# Patient Record
Sex: Female | Born: 1935 | Race: Black or African American | Hispanic: No | Smoking: Never smoker
Health system: Southern US, Community
[De-identification: ages and names within clinical notes are randomized; demographics above are authoritative.]

## PROBLEM LIST (undated history)

## (undated) DIAGNOSIS — I255 Ischemic cardiomyopathy: Secondary | ICD-10-CM

## (undated) DIAGNOSIS — I639 Cerebral infarction, unspecified: Secondary | ICD-10-CM

## (undated) DIAGNOSIS — E119 Type 2 diabetes mellitus without complications: Secondary | ICD-10-CM

## (undated) DIAGNOSIS — I480 Paroxysmal atrial fibrillation: Secondary | ICD-10-CM

## (undated) DIAGNOSIS — I10 Essential (primary) hypertension: Secondary | ICD-10-CM

## (undated) DIAGNOSIS — R413 Other amnesia: Secondary | ICD-10-CM

## (undated) DIAGNOSIS — I509 Heart failure, unspecified: Secondary | ICD-10-CM

## (undated) DIAGNOSIS — I2510 Atherosclerotic heart disease of native coronary artery without angina pectoris: Secondary | ICD-10-CM

---

## 2015-08-23 ENCOUNTER — Inpatient Hospital Stay (HOSPITAL_COMMUNITY)
Admit: 2015-08-23 | Discharge: 2015-08-23 | Disposition: A | Discharge status: Home / Self Care | Payer: Medicare HMO | Attending: Internal Medicine | Admitting: Internal Medicine

## 2015-08-23 ENCOUNTER — Inpatient Hospital Stay
Admission: EM | Admit: 2015-08-23 | Discharge: 2015-08-25 | DRG: 292 | Disposition: A | Discharge status: Home / Self Care | Payer: Medicare HMO | Attending: Internal Medicine | Admitting: Internal Medicine

## 2015-08-23 ENCOUNTER — Emergency Department: Payer: Medicare HMO

## 2015-08-23 ENCOUNTER — Encounter: Payer: Self-pay | Admitting: Emergency Medicine

## 2015-08-23 DIAGNOSIS — R7989 Other specified abnormal findings of blood chemistry: Secondary | ICD-10-CM

## 2015-08-23 DIAGNOSIS — Z806 Family history of leukemia: Secondary | ICD-10-CM

## 2015-08-23 DIAGNOSIS — R0902 Hypoxemia: Secondary | ICD-10-CM | POA: Diagnosis present

## 2015-08-23 DIAGNOSIS — J810 Acute pulmonary edema: Secondary | ICD-10-CM

## 2015-08-23 DIAGNOSIS — R778 Other specified abnormalities of plasma proteins: Secondary | ICD-10-CM

## 2015-08-23 DIAGNOSIS — E119 Type 2 diabetes mellitus without complications: Secondary | ICD-10-CM | POA: Diagnosis present

## 2015-08-23 DIAGNOSIS — Z6841 Body Mass Index (BMI) 40.0 and over, adult: Secondary | ICD-10-CM | POA: Diagnosis not present

## 2015-08-23 DIAGNOSIS — I69398 Other sequelae of cerebral infarction: Secondary | ICD-10-CM

## 2015-08-23 DIAGNOSIS — R0602 Shortness of breath: Secondary | ICD-10-CM | POA: Diagnosis present

## 2015-08-23 DIAGNOSIS — I10 Essential (primary) hypertension: Secondary | ICD-10-CM

## 2015-08-23 DIAGNOSIS — I5043 Acute on chronic combined systolic (congestive) and diastolic (congestive) heart failure: Secondary | ICD-10-CM | POA: Diagnosis present

## 2015-08-23 DIAGNOSIS — I5023 Acute on chronic systolic (congestive) heart failure: Secondary | ICD-10-CM | POA: Diagnosis not present

## 2015-08-23 DIAGNOSIS — I69311 Memory deficit following cerebral infarction: Secondary | ICD-10-CM

## 2015-08-23 DIAGNOSIS — I2510 Atherosclerotic heart disease of native coronary artery without angina pectoris: Secondary | ICD-10-CM | POA: Diagnosis present

## 2015-08-23 DIAGNOSIS — I480 Paroxysmal atrial fibrillation: Secondary | ICD-10-CM | POA: Diagnosis present

## 2015-08-23 DIAGNOSIS — I25111 Atherosclerotic heart disease of native coronary artery with angina pectoris with documented spasm: Secondary | ICD-10-CM | POA: Diagnosis not present

## 2015-08-23 DIAGNOSIS — I255 Ischemic cardiomyopathy: Secondary | ICD-10-CM | POA: Diagnosis present

## 2015-08-23 DIAGNOSIS — I110 Hypertensive heart disease with heart failure: Secondary | ICD-10-CM | POA: Diagnosis present

## 2015-08-23 DIAGNOSIS — Z79899 Other long term (current) drug therapy: Secondary | ICD-10-CM

## 2015-08-23 DIAGNOSIS — H539 Unspecified visual disturbance: Secondary | ICD-10-CM | POA: Diagnosis present

## 2015-08-23 DIAGNOSIS — I639 Cerebral infarction, unspecified: Secondary | ICD-10-CM

## 2015-08-23 DIAGNOSIS — R0682 Tachypnea, not elsewhere classified: Secondary | ICD-10-CM | POA: Diagnosis present

## 2015-08-23 DIAGNOSIS — I248 Other forms of acute ischemic heart disease: Secondary | ICD-10-CM | POA: Diagnosis present

## 2015-08-23 DIAGNOSIS — I509 Heart failure, unspecified: Secondary | ICD-10-CM

## 2015-08-23 DIAGNOSIS — Z803 Family history of malignant neoplasm of breast: Secondary | ICD-10-CM | POA: Diagnosis not present

## 2015-08-23 DIAGNOSIS — Z7902 Long term (current) use of antithrombotics/antiplatelets: Secondary | ICD-10-CM

## 2015-08-23 DIAGNOSIS — I340 Nonrheumatic mitral (valve) insufficiency: Secondary | ICD-10-CM | POA: Diagnosis not present

## 2015-08-23 DIAGNOSIS — I4891 Unspecified atrial fibrillation: Secondary | ICD-10-CM

## 2015-08-23 HISTORY — DX: Cerebral infarction, unspecified: I63.9

## 2015-08-23 HISTORY — DX: Other amnesia: R41.3

## 2015-08-23 HISTORY — DX: Essential (primary) hypertension: I10

## 2015-08-23 HISTORY — DX: Atherosclerotic heart disease of native coronary artery without angina pectoris: I25.10

## 2015-08-23 HISTORY — DX: Ischemic cardiomyopathy: I25.5

## 2015-08-23 HISTORY — DX: Type 2 diabetes mellitus without complications: E11.9

## 2015-08-23 HISTORY — DX: Morbid (severe) obesity due to excess calories: E66.01

## 2015-08-23 HISTORY — DX: Paroxysmal atrial fibrillation: I48.0

## 2015-08-23 LAB — CBC WITH DIFFERENTIAL/PLATELET
BASOS ABS: 0 10*3/uL (ref 0–0.1)
Basophils Relative: 0 %
EOS ABS: 0.1 10*3/uL (ref 0–0.7)
Eosinophils Relative: 1 %
HCT: 29.3 % — ABNORMAL LOW (ref 35.0–47.0)
Hemoglobin: 11.6 g/dL — ABNORMAL LOW (ref 12.0–16.0)
LYMPHS ABS: 2.3 10*3/uL (ref 1.0–3.6)
Lymphocytes Relative: 30 %
MCH: 39.7 pg — ABNORMAL HIGH (ref 26.0–34.0)
MCHC: 39.7 g/dL — AB (ref 32.0–36.0)
MCV: 99.8 fL (ref 80.0–100.0)
Monocytes Absolute: 0.8 10*3/uL (ref 0.2–0.9)
Monocytes Relative: 10 %
NEUTROS ABS: 4.5 10*3/uL (ref 1.4–6.5)
Neutrophils Relative %: 59 %
Platelets: 200 10*3/uL (ref 150–440)
RBC: 2.93 MIL/uL — ABNORMAL LOW (ref 3.80–5.20)
RDW: 24.8 % — AB (ref 11.5–14.5)
WBC: 7.7 10*3/uL (ref 3.6–11.0)

## 2015-08-23 LAB — COMPREHENSIVE METABOLIC PANEL
ALT: 16 U/L (ref 14–54)
AST: 27 U/L (ref 15–41)
Albumin: 3.9 g/dL (ref 3.5–5.0)
Alkaline Phosphatase: 117 U/L (ref 38–126)
Anion gap: 7 (ref 5–15)
BILIRUBIN TOTAL: 1.4 mg/dL — AB (ref 0.3–1.2)
BUN: 12 mg/dL (ref 6–20)
CALCIUM: 8.7 mg/dL — AB (ref 8.9–10.3)
CO2: 22 mmol/L (ref 22–32)
CREATININE: 0.87 mg/dL (ref 0.44–1.00)
Chloride: 112 mmol/L — ABNORMAL HIGH (ref 101–111)
GFR calc Af Amer: >60 mL/min (ref >60)
Glucose, Bld: 140 mg/dL — ABNORMAL HIGH (ref 65–99)
Potassium: 4.7 mmol/L (ref 3.5–5.1)
Sodium: 141 mmol/L (ref 135–145)
TOTAL PROTEIN: 7.5 g/dL (ref 6.5–8.1)

## 2015-08-23 LAB — GLUCOSE, CAPILLARY
GLUCOSE-CAPILLARY: 128 mg/dL — AB (ref 65–99)
Glucose-Capillary: 210 mg/dL — ABNORMAL HIGH (ref 65–99)

## 2015-08-23 LAB — BRAIN NATRIURETIC PEPTIDE: B Natriuretic Peptide: 651 pg/mL — ABNORMAL HIGH (ref 0.0–100.0)

## 2015-08-23 LAB — TROPONIN I: TROPONIN I: 0.05 ng/mL — AB (ref <0.031)

## 2015-08-23 MED ORDER — INSULIN ASPART 100 UNIT/ML ~~LOC~~ SOLN
0.0000 [IU] | Freq: Three times a day (TID) | SUBCUTANEOUS | Status: DC
  Administered 2015-08-23: 2 [IU] via SUBCUTANEOUS
  Administered 2015-08-23: 3 [IU] via SUBCUTANEOUS
  Administered 2015-08-24: 2 [IU] via SUBCUTANEOUS
  Administered 2015-08-24 – 2015-08-25 (×4): 1 [IU] via SUBCUTANEOUS
  Administered 2015-08-25: 2 [IU] via SUBCUTANEOUS
  Filled 2015-08-23: qty 3
  Filled 2015-08-23: qty 1
  Filled 2015-08-23: qty 2
  Filled 2015-08-23 (×2): qty 1
  Filled 2015-08-23 (×2): qty 2
  Filled 2015-08-23: qty 1

## 2015-08-23 MED ORDER — METOPROLOL SUCCINATE ER 100 MG PO TB24
100.0000 mg | ORAL_TABLET | Freq: Every day | ORAL | Status: DC
  Administered 2015-08-23 – 2015-08-25 (×3): 100 mg via ORAL
  Filled 2015-08-23 (×3): qty 1

## 2015-08-23 MED ORDER — APIXABAN 5 MG PO TABS
5.0000 mg | ORAL_TABLET | Freq: Two times a day (BID) | ORAL | Status: DC
  Administered 2015-08-23 – 2015-08-25 (×5): 5 mg via ORAL
  Filled 2015-08-23 (×5): qty 1

## 2015-08-23 MED ORDER — CLOPIDOGREL BISULFATE 75 MG PO TABS
75.0000 mg | ORAL_TABLET | Freq: Every day | ORAL | Status: DC
  Administered 2015-08-23 – 2015-08-25 (×3): 75 mg via ORAL
  Filled 2015-08-23 (×3): qty 1

## 2015-08-23 MED ORDER — ACETAMINOPHEN 325 MG PO TABS: 650.0000 mg | ORAL_TABLET | Freq: Four times a day (QID) | ORAL | Status: DC | PRN

## 2015-08-23 MED ORDER — NITROGLYCERIN 2 % TD OINT
0.5000 [in_us] | TOPICAL_OINTMENT | Freq: Four times a day (QID) | TRANSDERMAL | Status: DC
  Administered 2015-08-23 – 2015-08-24 (×4): 0.5 [in_us] via TOPICAL
  Filled 2015-08-23 (×3): qty 1

## 2015-08-23 MED ORDER — ASPIRIN 81 MG PO CHEW
324.0000 mg | CHEWABLE_TABLET | Freq: Once | ORAL | Status: AC
  Administered 2015-08-23: 324 mg via ORAL
  Filled 2015-08-23: qty 4

## 2015-08-23 MED ORDER — LINAGLIPTIN 5 MG PO TABS
5.0000 mg | ORAL_TABLET | Freq: Every day | ORAL | Status: DC
  Administered 2015-08-23 – 2015-08-25 (×3): 5 mg via ORAL
  Filled 2015-08-23 (×3): qty 1

## 2015-08-23 MED ORDER — NITROGLYCERIN 2 % TD OINT
1.0000 [in_us] | TOPICAL_OINTMENT | Freq: Once | TRANSDERMAL | Status: DC
  Filled 2015-08-23: qty 1

## 2015-08-23 MED ORDER — HYDRALAZINE HCL 50 MG PO TABS
50.0000 mg | ORAL_TABLET | Freq: Three times a day (TID) | ORAL | Status: DC
  Administered 2015-08-23 – 2015-08-24 (×3): 50 mg via ORAL
  Filled 2015-08-23 (×3): qty 1

## 2015-08-23 MED ORDER — SODIUM CHLORIDE 0.9 % IJ SOLN
3.0000 mL | Freq: Two times a day (BID) | INTRAMUSCULAR | Status: DC
  Administered 2015-08-23 – 2015-08-25 (×4): 3 mL via INTRAVENOUS

## 2015-08-23 MED ORDER — INSULIN ASPART 100 UNIT/ML ~~LOC~~ SOLN
4.0000 [IU] | Freq: Three times a day (TID) | SUBCUTANEOUS | Status: DC
  Administered 2015-08-23 – 2015-08-25 (×8): 4 [IU] via SUBCUTANEOUS
  Filled 2015-08-23 (×8): qty 4

## 2015-08-23 MED ORDER — FUROSEMIDE 10 MG/ML IJ SOLN
20.0000 mg | Freq: Two times a day (BID) | INTRAMUSCULAR | Status: DC
  Administered 2015-08-23 – 2015-08-24 (×3): 20 mg via INTRAVENOUS
  Filled 2015-08-23: qty 2
  Filled 2015-08-23: qty 4
  Filled 2015-08-23: qty 2

## 2015-08-23 MED ORDER — ATORVASTATIN CALCIUM 20 MG PO TABS
40.0000 mg | ORAL_TABLET | Freq: Every day | ORAL | Status: DC
  Administered 2015-08-24 – 2015-08-25 (×2): 40 mg via ORAL
  Filled 2015-08-23 (×3): qty 2

## 2015-08-23 MED ORDER — SODIUM CHLORIDE 0.9 % IV SOLN: 250.0000 mL | INTRAVENOUS | Status: DC | PRN

## 2015-08-23 MED ORDER — ACETAMINOPHEN 650 MG RE SUPP: 650.0000 mg | Freq: Four times a day (QID) | RECTAL | Status: DC | PRN

## 2015-08-23 MED ORDER — IRBESARTAN 75 MG PO TABS
75.0000 mg | ORAL_TABLET | Freq: Every day | ORAL | Status: DC
  Administered 2015-08-23 – 2015-08-25 (×3): 75 mg via ORAL
  Filled 2015-08-23 (×3): qty 1

## 2015-08-23 MED ORDER — SODIUM CHLORIDE 0.9 % IJ SOLN
3.0000 mL | Freq: Two times a day (BID) | INTRAMUSCULAR | Status: DC
  Administered 2015-08-23: 3 mL via INTRAVENOUS

## 2015-08-23 MED ORDER — VITAMIN B-12 1000 MCG PO TABS
1000.0000 ug | ORAL_TABLET | Freq: Every day | ORAL | Status: DC
  Administered 2015-08-23 – 2015-08-25 (×3): 1000 ug via ORAL
  Filled 2015-08-23 (×3): qty 1

## 2015-08-23 MED ORDER — SODIUM CHLORIDE 0.9 % IJ SOLN: 3.0000 mL | INTRAMUSCULAR | Status: DC | PRN

## 2015-08-23 MED ORDER — FUROSEMIDE 10 MG/ML IJ SOLN
20.0000 mg | Freq: Once | INTRAMUSCULAR | Status: AC
  Administered 2015-08-23: 20 mg via INTRAVENOUS
  Filled 2015-08-23: qty 4

## 2015-08-23 MED ORDER — INSULIN ASPART 100 UNIT/ML ~~LOC~~ SOLN: 0.0000 [IU] | Freq: Every day | SUBCUTANEOUS | Status: DC

## 2015-08-23 MED ORDER — DILTIAZEM HCL ER COATED BEADS 120 MG PO CP24
120.0000 mg | ORAL_CAPSULE | Freq: Every day | ORAL | Status: DC
  Administered 2015-08-23 – 2015-08-25 (×3): 120 mg via ORAL
  Filled 2015-08-23 (×3): qty 1

## 2015-08-23 NOTE — Consult Note (Signed)
Cardiology Consultation Note  Patient ID: Elizabeth RampGlenda Bailly, MRN: [id number], DOB/AGE: 78/01/1935 78 y.o. Admit date: 08/23/2015   Date of Consult: 08/23/2015 Primary Physician: No PCP Per Patient Primary Cardiologist: Dr. Redmond Pullingroy, Leo, MD [facility]  Chief Complaint: SOB Reason for Consult: Acute on chronic systolic CHF  HPI: 80 y.o. female with h/o CAD medically managed by cardiac cath 11/2014, PAF on Eliquis, recent right sided stroke, with residual eye vision and memory loss, HTN, DM2, and morbid obesity who [facility] with increasing SOB over the past couple of days. Cardiology was consulted for evaluation of possible acute on chronic ischemic cardiomyopathy.   She is followed for her cardiology diagnoses by [facility]. She underwent cardiac catheterization in the setting of worsening SOB and acute respiratory failure in 11/2014 that showed patient daughter reported three vessel disease, and was medically managed at that time. Full cath report and any possible echo has been [facility]. Since then she had done well per her daughter up until September 2016 when she suffered an acute stroke. Her Eliquis had not been held for any procedure leading up to this, nor had she missed any doses of the medication per the daughter. She is recovering well from the stroke.   Per the daughter, the patient began to develop increasing SOB over the past couple of days with associated early satiety. She also notes some lower extremity edema. Weight has been "excellent." Last weight at her nursing home 4 days ago was 242, and upon her admission to [facility] her weight was 242. She has been sleeping with 1-2 pillows. She does not use extra salt, nor does she eat out at restaurants.   Upon her arrival to [facility] she was noted to be mildly hypoxic with O2 sats mildly low at 93%, requiring supplemental oxygen via [place]. CXR showed vascular congestion and mild cardiomegaly with increased interstitial markings,  concerning for pulmonary edema. BNP 651, troponin 0.05. She was started on IV Lasix 20 mg bid. Echo is pending. She is now off O2 and resting comfortably. We have contacted [facility] and faxed records release to gather her cardiology records.      Past Medical History  Diagnosis Date  . Hypertension   . PAF (paroxysmal atrial fibrillation) (HCC)     a. on Eliquis  . Diabetes mellitus without complication (HCC)   . CAD (coronary artery disease)     a. medically managed; b. last cardiac cath 11/2014  . Morbid obesity (HCC)   . Stroke (HCC)   . Diabetes mellitus (HCC)   . Memory loss   . Ischemic cardiomyopathy       Most Recent Cardiac Studies: [facility] - release has been faxed as of 2:30 PM, 08/23/2015.    Surgical History: History reviewed. No pertinent past surgical history.   Home Meds: Prior to Admission medications   Medication Sig Start Date End Date Taking? Authorizing Provider  apixaban (ELIQUIS) 5 MG TABS tablet Take 5 mg by mouth 2 (two) times daily.   Yes Historical Provider, MD  clopidogrel (PLAVIX) 75 MG tablet Take 75 mg by mouth daily.   Yes Historical Provider, MD  diltiazem (CARDIZEM CD) 120 MG 24 hr capsule Take 120 mg by mouth daily.   Yes Historical Provider, MD  hydrALAZINE (APRESOLINE) 50 MG tablet Take 50 mg by mouth every 8 (eight) hours.    Yes Historical Provider, MD  metoprolol succinate (TOPROL-XL) 100 MG 24 hr tablet Take 100 mg  by mouth daily.    Yes Historical Provider, MD  sitaGLIPtin (JANUVIA) 100 MG tablet Take 100 mg by mouth daily.   Yes Historical Provider, MD  valsartan (DIOVAN) 80 MG tablet Take 80 mg by mouth daily.   Yes Historical Provider, MD  vitamin B-12 (CYANOCOBALAMIN) 1000 MCG tablet Take 1,000 mcg by mouth daily.   Yes Historical Provider, MD    Inpatient Medications:  . apixaban  5 mg Oral BID  . atorvastatin  40 mg Oral q1800  . clopidogrel  75 mg Oral Daily  . diltiazem  120 mg Oral Daily  . furosemide  20 mg  Intravenous BID  . hydrALAZINE  50 mg Oral 3 times per day  . insulin aspart  0-5 Units Subcutaneous QHS  . insulin aspart  0-9 Units Subcutaneous TID WC  . insulin aspart  4 Units Subcutaneous TID WC  . irbesartan  75 mg Oral Daily  . linagliptin  5 mg Oral Daily  . metoprolol succinate  100 mg Oral Daily  . nitroGLYCERIN  0.5 inch Topical 4 times per day  . nitroGLYCERIN  1 inch Topical Once  . sodium chloride  3 mL Intravenous Q12H  . sodium chloride  3 mL Intravenous Q12H  . vitamin B-12  1,000 mcg Oral Daily      Allergies: No Known Allergies  Social History   Social History  . Marital Status: Widowed    Spouse Name: N/A  . Number of Children: N/A  . Years of Education: N/A   Occupational History  . Not on file.   Social History Main Topics  . Smoking status: Never Smoker   . Smokeless tobacco: Not on file  . Alcohol Use: No  . Drug Use: Not on file  . Sexual Activity: Not on file   Other Topics Concern  . Not on file   Social History Narrative  . No narrative on file     History reviewed. No pertinent family history.   Review of Systems: Review of Systems  Constitutional: Positive for weight loss and malaise/fatigue. Negative for fever, chills and diaphoresis.  HENT: Negative for congestion.   Eyes: Positive for blurred vision. Negative for pain, discharge and redness.  Respiratory: Positive for shortness of breath. Negative for cough, hemoptysis, sputum production and wheezing.   Cardiovascular: Positive for palpitations and leg swelling. Negative for chest pain, orthopnea, claudication and PND.  Gastrointestinal: Negative for heartburn, nausea, vomiting, abdominal pain, blood in stool and melena.  Genitourinary: Negative for hematuria.  Musculoskeletal: Negative for myalgias and falls.  Skin: Negative for rash.  Neurological: Positive for weakness. Negative for dizziness, tingling, tremors, sensory change, speech change and focal weakness.    Endo/Heme/Allergies: Does not bruise/bleed easily.  Psychiatric/Behavioral: Negative for substance abuse. The patient is not nervous/anxious.   All other systems reviewed and are negative.   Labs:  Recent Labs  08/23/15 0640  TROPONINI 0.05*   Lab Results  Component Value Date   WBC 7.7 08/23/2015   HGB 11.6* 08/23/2015   HCT 29.3* 08/23/2015   MCV 99.8 08/23/2015   PLT 200 08/23/2015     Recent Labs Lab 08/23/15 0640  NA 141  K 4.7  CL 112*  CO2 22  BUN 12  CREATININE 0.87  CALCIUM 8.7*  PROT 7.5  BILITOT 1.4*  ALKPHOS 117  ALT 16  AST 27  GLUCOSE 140*   No results found for: CHOL, HDL, LDLCALC, TRIG No results found for: DDIMER  Radiology/Studies:  Dg  Chest Port 1 View  08/23/2015  CLINICAL DATA:  Acute onset of shortness of breath. Initial encounter. EXAM: PORTABLE CHEST 1 VIEW COMPARISON:  None. FINDINGS: The lungs are well-aerated. Vascular congestion is noted, with increased interstitial markings, concerning for pulmonary edema. There is no evidence of focal opacification, pleural effusion or pneumothorax. The cardiomediastinal silhouette is mildly enlarged. No acute osseous abnormalities are seen. IMPRESSION: Vascular congestion and mild cardiomegaly, with increased interstitial markings, concerning for pulmonary edema. Electronically Signed   By: Roanna Raider M.D.   On: 08/23/2015 05:54    EKG: sinus bradycardia, 55 bpm, inferolateral TWI   Weights: Filed Weights   08/23/15 0453 08/23/15 0459  Weight: 242 lb (109.77 kg) 242 lb (109.77 kg)     Physical Exam: Blood pressure 167/66, pulse 63, temperature 98.2 F (36.8 C), temperature source Oral, resp. rate 27, height  (1.626 m), weight 242 lb (109.77 kg), SpO2 98 %. Body mass index is 41.52 kg/(m^2). General: Well developed, well nourished, in no acute distress. Head: Normocephalic, atraumatic, sclera non-icteric, no xanthomas, nares are without discharge.  Neck: Negative for carotid bruits.  JVD not elevated. Lungs: Faint bilateral crackles at the bases. Breathing is unlabored. Heart: RRR with S1 S2. No murmurs, rubs, or gallops appreciated. Abdomen: Obese, soft, non-tender, non-distended with normoactive bowel sounds. No hepatomegaly. No rebound/guarding. No obvious abdominal masses. Msk:  Strength and tone appear normal for age. Extremities: No clubbing or cyanosis. No edema.  Distal pedal pulses are 2+ and equal bilaterally. Neuro: Alert and oriented X 3. No facial asymmetry. No focal deficit. Moves all extremities spontaneously. Psych:  Responds to questions appropriately with a normal affect.    Assessment and Plan:  80 y.o. female with h/o CAD medically managed by cardiac cath 11/2014, PAF on Eliquis, recent right sided stroke, with residual eye vision and memory loss, HTN, DM2, and morbid obesity who presented to [facility] with increasing SOB over the past couple of days. Cardiology was consulted for evaluation of possible acute on chronic ischemic cardiomyopathy.  1. Acute on chronic systolic CHF/ischemic cardiomyopathy: -We have contacted [facility], faxed records release form, and are awaiting their response to review prior cardiology records -Agree with IV Lasix, monitor her urine output, if needed increase to 40 mg bid -Echo is pending to evaluate LV function, wall motion, and right side pressure, will compare to old study once received from [facility] -Continue nitro paste at this time, could transition to Imdur -Monitor Ins and Outs  2. Elevated troponin: -Mildly elevated at 0.05, likely supply demand ischemia in the setting of volume overload  -Continue to cycle -Reportedly, per patient and her daughter, recent cardiac cath 11/2014 that was managed medically -It is unclear if the ECG changes are new or old, await prior records   3. CAD: -No angina -Continue Plavix 75 mg daily, Toprol XL 100 mg daily, and valsartan 80 mg daily -On Eliquis in place of aspirin   4.  PAF: -Currently in sinus rhythm -Continue Eliquis 5 mg bid -Continue Toprol XL 100 mg daily and Cardizem 120 mg daily -CHADSVASc at least 9 (CHF, HTN, DM, age x 2, stroke, vascular disease, female)   5. History of recent stroke: -Apparently this occurred while on Eliquis -Uncertain if this was true drug failure or not -She is recovering well -Would leave to primary MD long term treatment option     Signed, Eula Listen, PA-C Pager: [phone] 08/23/2015, 2:27 PM

## 2015-08-23 NOTE — ED Notes (Signed)
Critical troponin of 0.05 called from lab. Dr. Dolores FrameSung notified.

## 2015-08-23 NOTE — ED Notes (Signed)
Pt states "feels a little more short of breath, but not to the extreme." pt able to speak in full sentences, remains tachypnic at rest. Dr. Dolores FrameSung notified.

## 2015-08-23 NOTE — H&P (Signed)
[facility] [place] at [facility]   PATIENT NAME: Elizabeth Wilkerson    MR#:  [id number]  DATE OF BIRTH:  09/28/1935  DATE OF ADMISSION:  08/23/2015  PRIMARY CARE PHYSICIAN: No PCP Per Patient   REQUESTING/REFERRING PHYSICIAN:   CHIEF COMPLAINT:   Chief Complaint  Patient presents with  . Shortness of Breath    HISTORY OF PRESENT ILLNESS: Elizabeth Wilkerson  is a 80 y.o. female with a known history of multiple medical problems including recent stroke with right eye vision problems and memory loss, Atrial fibrillation for which patient is on Eliquis, coronary artery disease, with difficulty to access coronary arteries, which are severely occluded for which patient is being treated with Plavix,  History of congestive heart failure, diabetes, hypertension who presents to the hospital with complaints of shortness of breath especially on exertion. He also noted some swelling in lower extremities. Patient's daughter reports PND as well as 2 pillow orthopnea. Patient also snores at and is sleeping at the time. On arrival to emergency room, she was noted to be mildly hypoxic with O2 sats of 93% on exertion on room air. Chest x-ray revealed CHF. Hospitalist services were contacted for admission.   PAST MEDICAL HISTORY:   Past Medical History  Diagnosis Date  . Hypertension   . Irregular heart beat   . Diabetes mellitus without complication (HCC)     PAST SURGICAL HISTORY: History reviewed. No pertinent past surgical history.  SOCIAL HISTORY:  Social History  Substance Use Topics  . Smoking status: Never Smoker   . Smokeless tobacco: Not on file  . Alcohol Use: No    FAMILY HISTORY: Patient's family history significant for patient's mom dying of leukemia. Patient's aunt had breast cancer. No history of early coronary artery disease, diabetes, hypertension, hyperlipidemia  DRUG ALLERGIES: No Known Allergies  Review of Systems  Constitutional: Negative for fever,  chills, weight loss and malaise/fatigue.  HENT: Negative for congestion.   Eyes: Positive for blurred vision. Negative for double vision.  Respiratory: Positive for shortness of breath. Negative for cough, sputum production and wheezing.   Cardiovascular: Positive for leg swelling and PND. Negative for chest pain, palpitations and orthopnea.  Gastrointestinal: Negative for nausea, vomiting, abdominal pain, diarrhea, constipation, blood in stool and melena.  Genitourinary: Negative for dysuria, urgency, frequency and hematuria.  Musculoskeletal: Negative for falls.  Skin: Negative for rash.  Neurological: Negative for dizziness and weakness.  Psychiatric/Behavioral: Negative for depression and memory loss. The patient is not nervous/anxious.     MEDICATIONS AT HOME:  Prior to Admission medications   Medication Sig Start Date End Date Taking? Authorizing Provider  apixaban (ELIQUIS) 5 MG TABS tablet Take 5 mg by mouth 2 (two) times daily.   Yes Historical Provider, MD  clopidogrel (PLAVIX) 75 MG tablet Take 75 mg by mouth daily.   Yes Historical Provider, MD  diltiazem (CARDIZEM CD) 120 MG 24 hr capsule Take 120 mg by mouth daily.   Yes Historical Provider, MD  hydrALAZINE (APRESOLINE) 50 MG tablet Take 50 mg by mouth every 8 (eight) hours.    Yes Historical Provider, MD  metoprolol succinate (TOPROL-XL) 100 MG 24 hr tablet Take 100 mg by mouth daily.    Yes Historical Provider, MD  sitaGLIPtin (JANUVIA) 100 MG tablet Take 100 mg by mouth daily.   Yes Historical Provider, MD  valsartan (DIOVAN) 80 MG tablet Take 80 mg by mouth daily.   Yes Historical Provider, MD  vitamin B-12 (CYANOCOBALAMIN) 1000  MCG tablet Take 1,000 mcg by mouth daily.   Yes Historical Provider, MD      PHYSICAL EXAMINATION:   VITAL SIGNS: Blood pressure 177/90, pulse 58, temperature 98.2 F (36.8 C), temperature source Oral, resp. rate 21, height  (1.626 m), weight 109.77 kg (242 lb), SpO2 93 %.  GENERAL:  80  y.o.-year-old patient sitting on the edge of bed with no acute distress. Agitated and uncomfortable EYES: Pupils equal, round, reactive to light and accommodation. No scleral icterus. Extraocular muscles intact.  HEENT: Head atraumatic, normocephalic. Oropharynx and nasopharynx clear.  NECK:  Supple, no jugular venous distention. No thyroid enlargement, no tenderness.  LUNGS: Normal breath sounds bilaterally posteriorly, no wheezing, but patient has significant rales,rhonchi and crepitations anteriorly. No use of accessory muscles of respiration. Sitting up CARDIOVASCULAR: S1, S2 normal. No murmurs, rubs, or gallops.  ABDOMEN: Soft, nontender, nondistended. Bowel sounds present. No organomegaly or mass.  EXTREMITIES: No pedal edema, cyanosis, or clubbing.  NEUROLOGIC: Cranial nerves II through XII are intact. Muscle strength 5/5 in all extremities. Sensation intact. Gait not checked.  PSYCHIATRIC: The patient is alert and oriented x 3. Agitated, uncomfortable SKIN: No obvious rash, lesion, or ulcer.   LABORATORY PANEL:   CBC No results for input(s): WBC, HGB, HCT, PLT, MCV, MCH, MCHC, RDW, LYMPHSABS, MONOABS, EOSABS, BASOSABS, BANDABS in the last 168 hours.  Invalid input(s): NEUTRABS, BANDSABD ------------------------------------------------------------------------------------------------------------------  Chemistries   Recent Labs Lab 08/23/15 0640  NA 141  K 4.7  CL 112*  CO2 22  GLUCOSE 140*  BUN 12  CREATININE 0.87  CALCIUM 8.7*  AST 27  ALT 16  ALKPHOS 117  BILITOT 1.4*   ------------------------------------------------------------------------------------------------------------------  Cardiac Enzymes  Recent Labs Lab 08/23/15 0640  TROPONINI 0.05*   ------------------------------------------------------------------------------------------------------------------  RADIOLOGY: Dg Chest Port 1 View  08/23/2015  CLINICAL DATA:  Acute onset of shortness of  breath. Initial encounter. EXAM: PORTABLE CHEST 1 VIEW COMPARISON:  None. FINDINGS: The lungs are well-aerated. Vascular congestion is noted, with increased interstitial markings, concerning for pulmonary edema. There is no evidence of focal opacification, pleural effusion or pneumothorax. The cardiomediastinal silhouette is mildly enlarged. No acute osseous abnormalities are seen. IMPRESSION: Vascular congestion and mild cardiomegaly, with increased interstitial markings, concerning for pulmonary edema. Electronically Signed   By: Roanna Raider M.D.   On: 08/23/2015 05:54    EKG: Orders placed or performed during the hospital encounter of 08/23/15  . EKG 12-Lead  . EKG 12-Lead   EKG in the emergency room 08/23/2015 showed sinus bradycardia at 55 bpm, normal axis, left ventricular hypertrophy with repolarization abnormality, nonspecific ST-T changes  IMPRESSION AND PLAN:  Active Problems:   Acute pulmonary edema (HCC)   CHF, acute on chronic (HCC)   CAD (coronary artery disease)   Atrial fibrillation (HCC)   HTN (hypertension)   CVA (cerebral infarction)   CHF (congestive heart failure) (HCC) 1. Acute pulmonary edema due to acute on chronic congestive heart failure. Admit patient medical floor, continue her on Lasix as well as nitroglycerin topically, following patient's ins and outs closely. Get echocardiogram 2. Elevated troponin, likely demand ischemia due to underlying coronary artery disease. Continue Plavix. Add Lipitor.  We will get Cardiology consultation for further recommendations 3. Diabetes mellitus type 2 without complications. Get hemoglobin A1c. Continue sliding scale insulin and her usual outpatient medications as well as diabetic diet 4. History of hypertension, continue outpatient management 5. History of atrial fibrillation, likely paroxysmal. Patient is in sinus rhythm now, continue liquids as  well as Cardizem and metoprolol. Patient's heart rate is well controlled. Get  an echocardiogram as well as cardiology consultation in regards to her management of this complicated patient as she is on a Eliquis as well as Plavix   All the records are reviewed and case discussed with ED provider. Management plans discussed with the patient, family and they are in agreement.  CODE STATUS:  Full code  TOTAL TIME TAKING CARE OF THIS PATIENT: 50 minutes.   Case was discussed with patient's family, all questions were answered , voiced understanding, coordination of care time 15 minutes Kymberlie Brazeau M.D on 08/23/2015 at 7:52 AM  Between 7am to 6pm - Pager - [phone] After 6pm go to [email or web address] - password [email or web address] [facility]  [place] [facility]  Office  [phone]  CC: Primary care physician; No PCP Per Patient

## 2015-08-23 NOTE — ED Notes (Signed)
Pt ambulated around treatment room with cont pox in place. Pt with pox of 94-95% while ambulating, however pt with increased resp rate to 34 with ambulation. Pt is able to speak in full sentences after sitting down for approx one minute after ambulation, with return of resp rate to 24. Pt states she does not feel as if she is having increased shob compared to usual.

## 2015-08-23 NOTE — ED Notes (Signed)
Report to collyn, rn 

## 2015-08-23 NOTE — Progress Notes (Signed)
*  PRELIMINARY RESULTS* Echocardiogram 2D Echocardiogram has been performed.  Elizabeth HousekeeperJerry R Hege 08/23/2015, 3:13 PM

## 2015-08-23 NOTE — ED Notes (Signed)
Call bell at side. Pt updated on lab result progress. Pt verbalizes understanding. Pt informed to please call for assistance to utilize restroom.

## 2015-08-23 NOTE — ED Notes (Signed)
Patient reports being short of breath for several hours that has gotten progressively worse.

## 2015-08-23 NOTE — ED Provider Notes (Signed)
[facility] Emergency Department Provider Note  ____________________________________________  Time seen: Approximately 5:34 AM  I have reviewed the triage vital signs and the nursing notes.   HISTORY  Chief Complaint Shortness of Breath    HPI Elizabeth Wilkerson is a 80 y.o. female who presents the ED from home with a chief complaint of shortness of breath. Patient lives in [place]; had a stroke approximately 1 month prior and is currently staying with her daughter locally. Both note occasional short of breath 3-4 days, especially on exertion. Patient denies fever, chills, cough, congestion, chest pain, abdominal pain, nausea, vomiting, diarrhea. Denies increased swelling in legs. Denies recent travel. Has not had to sleep on more pillows or in recliner.Her daughter questions if her mother may have sleep apnea as she is a loud snorer and occasionally gasps for air during her sleep.   Past Medical History  Diagnosis Date  . Hypertension   . Irregular heart beat   . Diabetes mellitus without complication (HCC)     There are no active problems to display for this patient.   History reviewed. No pertinent past surgical history.  Current Outpatient Rx  Name  Route  Sig  Dispense  Refill  . apixaban (ELIQUIS) 5 MG TABS tablet   Oral   Take 5 mg by mouth 2 (two) times daily.         . clopidogrel (PLAVIX) 75 MG tablet   Oral   Take 75 mg by mouth daily.         Marland Kitchen diltiazem (CARDIZEM CD) 120 MG 24 hr capsule   Oral   Take 120 mg by mouth daily.         . hydrALAZINE (APRESOLINE) 50 MG tablet   Oral   Take 50 mg by mouth 3 (three) times daily.         . metoprolol succinate (TOPROL-XL) 100 MG 24 hr tablet   Oral   Take 100 mg by mouth daily. Take with or immediately following a meal.         . sitaGLIPtin (JANUVIA) 100 MG tablet   Oral   Take 100 mg by mouth daily.         . valsartan (DIOVAN) 80 MG tablet   Oral   Take 80 mg by mouth  daily.           Allergies Review of patient's allergies indicates no known allergies.  History reviewed. No pertinent family history.  Social History Social History  Substance Use Topics  . Smoking status: Never Smoker   . Smokeless tobacco: None  . Alcohol Use: No    Review of Systems Constitutional: No fever/chills Eyes: No visual changes. ENT: No sore throat. Cardiovascular: Denies chest pain. Respiratory: Positive for shortness of breath. Gastrointestinal: No abdominal pain.  No nausea, no vomiting.  No diarrhea.  No constipation. Genitourinary: Negative for dysuria. Musculoskeletal: Negative for back pain. Skin: Negative for rash. Neurological: Negative for headaches, focal weakness or numbness.  10-point ROS otherwise negative.  ____________________________________________   PHYSICAL EXAM:  VITAL SIGNS: ED Triage Vitals  Enc Vitals Group     BP 08/23/15 0453 171/76 mmHg     Pulse Rate 08/23/15 0453 61     Resp 08/23/15 0453 20     Temp 08/23/15 0453 98.2 F (36.8 C)     Temp Source 08/23/15 0453 Oral     SpO2 08/23/15 0453 99 %     Weight 08/23/15 0453 242 lb (109.77 kg)  Height 08/23/15 0453 5\' 4"  (1.626 m)     Head Cir --      Peak Flow --      Pain Score 08/23/15 0456 0     Pain Loc --      Pain Edu? --      Excl. in GC? --     Constitutional: Alert and oriented. Well appearing and in no acute distress. Eyes: Conjunctivae are normal. PERRL. EOMI. Head: Atraumatic. Nose: No congestion/rhinnorhea. Mouth/Throat: Mucous membranes are moist.  Oropharynx non-erythematous. Neck: No stridor. No carotid bruits. Cardiovascular: Normal rate, regular rhythm. Grossly normal heart sounds.  Good peripheral circulation. Respiratory: Normal respiratory effort.  No retractions. Lungs mildly diminished bibasilarly. Gastrointestinal: Soft and nontender. No distention. No abdominal bruits. No CVA tenderness. Musculoskeletal: No lower extremity tenderness nor  edema.  No joint effusions. Neurologic:  Normal speech and language. No gross focal neurologic deficits are appreciated.  Skin:  Skin is warm, dry and intact. No rash noted. Psychiatric: Mood and affect are normal. Speech and behavior are normal.  ____________________________________________   LABS (all labs ordered are listed, but only abnormal results are displayed)  Labs Reviewed  COMPREHENSIVE METABOLIC PANEL - Abnormal; Notable for the following:    Chloride 112 (*)    Glucose, Bld 140 (*)    Calcium 8.7 (*)    Total Bilirubin 1.4 (*)    All other components within normal limits  TROPONIN I - Abnormal; Notable for the following:    Troponin I 0.05 (*)    All other components within normal limits  CBC WITH DIFFERENTIAL/PLATELET  BRAIN NATRIURETIC PEPTIDE  CBC WITH DIFFERENTIAL/PLATELET   ____________________________________________  EKG  ED ECG REPORT I, SUNG,JADE J, the attending physician, personally viewed and interpreted this ECG.   Date: 08/23/2015  EKG Time: 0502  Rate: 55  Rhythm: sinus bradycardia  Axis: Normal  Intervals:none  ST&T Change: Nonspecific  ____________________________________________  RADIOLOGY  Portable chest x-ray (viewed by me, interpreted per Dr. Lenis Noonchain): Vascular congestion and mild cardiomegaly, with increased interstitial markings, concerning for pulmonary edema.  ____________________________________________   PROCEDURES  Procedure(s) performed: None  Critical Care performed: No  ____________________________________________   INITIAL IMPRESSION / ASSESSMENT AND PLAN / ED COURSE  Pertinent labs & imaging results that were available during my care of the patient were reviewed by me and considered in my medical decision making (see chart for details).  80 year old female with a history of diabetes, CVA, hypertension who presents with shortness of breath over the past 3-4 days, increase on exertion. Will obtain screening lab  work including BNP, troponin, obtain chest x-ray. Will ambulate patient with pulse oximeter.  ----------------------------------------- 7:19 AM on 08/23/2015 -----------------------------------------  Patient ambulated with pulse oximeter by nurse; she was not hypoxic, Lois room air saturations was 93%. However, patient became quite dyspneic with some chest tightness. IV Lasix ordered for pulmonary edema seen on chest x-ray. Noted elevated troponin. Discussed with hospitalist for evaluation in the ED for admission. ____________________________________________   FINAL CLINICAL IMPRESSION(S) / ED DIAGNOSES  Final diagnoses:  Acute congestive heart failure, unspecified congestive heart failure type (HCC)  Shortness of breath  Tachypnea  Elevated troponin      Irean HongJade J Sung, MD 08/23/15 [phone]

## 2015-08-23 NOTE — Progress Notes (Signed)
Skin assessment verified by Alfredo Battyammy T., RN.

## 2015-08-24 DIAGNOSIS — I10 Essential (primary) hypertension: Secondary | ICD-10-CM

## 2015-08-24 DIAGNOSIS — I25111 Atherosclerotic heart disease of native coronary artery with angina pectoris with documented spasm: Secondary | ICD-10-CM

## 2015-08-24 DIAGNOSIS — R0682 Tachypnea, not elsewhere classified: Secondary | ICD-10-CM

## 2015-08-24 DIAGNOSIS — I5043 Acute on chronic combined systolic (congestive) and diastolic (congestive) heart failure: Secondary | ICD-10-CM | POA: Insufficient documentation

## 2015-08-24 DIAGNOSIS — I5023 Acute on chronic systolic (congestive) heart failure: Secondary | ICD-10-CM

## 2015-08-24 DIAGNOSIS — I639 Cerebral infarction, unspecified: Secondary | ICD-10-CM

## 2015-08-24 DIAGNOSIS — R0602 Shortness of breath: Secondary | ICD-10-CM | POA: Insufficient documentation

## 2015-08-24 LAB — BASIC METABOLIC PANEL
Anion gap: 6 (ref 5–15)
BUN: 12 mg/dL (ref 6–20)
CALCIUM: 8.5 mg/dL — AB (ref 8.9–10.3)
CO2: 27 mmol/L (ref 22–32)
Chloride: 108 mmol/L (ref 101–111)
Creatinine, Ser: 0.93 mg/dL (ref 0.44–1.00)
GFR calc Af Amer: >60 mL/min (ref >60)
GFR, EST NON AFRICAN AMERICAN: 58 mL/min — AB (ref >60)
GLUCOSE: 125 mg/dL — AB (ref 65–99)
Potassium: 5.6 mmol/L — ABNORMAL HIGH (ref 3.5–5.1)
Sodium: 141 mmol/L (ref 135–145)

## 2015-08-24 LAB — LIPID PANEL
CHOL/HDL RATIO: 2.5 ratio
Cholesterol: 101 mg/dL (ref 0–200)
HDL: 41 mg/dL (ref >40)
LDL Cholesterol: 51 mg/dL (ref 0–99)
Triglycerides: 47 mg/dL (ref <150)
VLDL: 9 mg/dL (ref 0–40)

## 2015-08-24 LAB — GLUCOSE, CAPILLARY
GLUCOSE-CAPILLARY: 133 mg/dL — AB (ref 65–99)
Glucose-Capillary: 123 mg/dL — ABNORMAL HIGH (ref 65–99)
Glucose-Capillary: 147 mg/dL — ABNORMAL HIGH (ref 65–99)

## 2015-08-24 LAB — HEMOGLOBIN A1C: HEMOGLOBIN A1C: 6.5 % — AB (ref 4.0–6.0)

## 2015-08-24 LAB — TROPONIN I: Troponin I: 0.05 ng/mL — ABNORMAL HIGH (ref <0.031)

## 2015-08-24 MED ORDER — ISOSORBIDE MONONITRATE ER 30 MG PO TB24
30.0000 mg | ORAL_TABLET | Freq: Two times a day (BID) | ORAL | Status: DC
  Administered 2015-08-24 – 2015-08-25 (×2): 30 mg via ORAL
  Filled 2015-08-24 (×2): qty 1

## 2015-08-24 MED ORDER — NITROGLYCERIN 2 % TD OINT
1.0000 [in_us] | TOPICAL_OINTMENT | Freq: Four times a day (QID) | TRANSDERMAL | Status: DC
  Administered 2015-08-24: 1 [in_us] via TOPICAL
  Filled 2015-08-24 (×2): qty 1

## 2015-08-24 MED ORDER — HYDRALAZINE HCL 50 MG PO TABS
100.0000 mg | ORAL_TABLET | Freq: Three times a day (TID) | ORAL | Status: DC
  Administered 2015-08-24 – 2015-08-25 (×4): 100 mg via ORAL
  Filled 2015-08-24 (×4): qty 2

## 2015-08-24 MED ORDER — SODIUM POLYSTYRENE SULFONATE 15 GM/60ML PO SUSP
30.0000 g | Freq: Once | ORAL | Status: AC
  Administered 2015-08-24: 30 g via ORAL
  Filled 2015-08-24: qty 120

## 2015-08-24 MED ORDER — FUROSEMIDE 10 MG/ML IJ SOLN
40.0000 mg | Freq: Two times a day (BID) | INTRAMUSCULAR | Status: DC
  Administered 2015-08-24 – 2015-08-25 (×3): 40 mg via INTRAVENOUS
  Filled 2015-08-24 (×3): qty 4

## 2015-08-24 NOTE — Care Management (Signed)
patient presented to ED with shortness of breath.  CM notices that patient's address on the face sheet indicates she is from [place].  Met with patient.  She was leaning to one side over the bedrail while talking with care manager.  She says that she has been with her daughter Essie Christine "for a few days.  You will have to quit asking me questions.  I do not know anything- you will have to ask my daughter."  Have left voicemail for  Ms Marshell Levan.  It appears that patient may have had a recent hospitalization for a CVA.  Patient is not wearing 02 and says she does not know if she has anything at home."

## 2015-08-24 NOTE — Progress Notes (Signed)
[facility] [place] [facility]   PATIENT NAME: Elizabeth RampGlenda Wilkerson    MR#:  [id number]  DATE OF BIRTH:  12/29/1934  SUBJECTIVE:  CHIEF COMPLAINT:   Chief Complaint  Patient presents with  . Shortness of Breath   patient is a 80 year old female with history of multiple medical problems significant for stroke, A. fib for which she is on a liquids. Coronary artery disease, congestive heart failure who presents to the hospital with shortness of breath on exertion as well as lower extremity swelling. Chest x-ray was concerning for congestive heart failure and she was admitted. She was borderline hypoxic. She was initiated on diuretic and her condition improved. She denies any significant pain now or shortness of breath. Her ins and outs approximately 400 cc negative. O2 sats are 96% on room air at rest. Echocardiogram revealed ejection fraction of 45-50% as well as wall motion abnormalities. Results from prior echocardiogram as well as cardiac catheterization results are being awaited by cardiologist to make decisions in regards to therapy. Minimal elevation of troponin, no chest pain  Review of Systems  Constitutional: Negative for fever, chills and weight loss.  HENT: Negative for congestion.   Eyes: Negative for blurred vision and double vision.  Respiratory: Negative for cough, sputum production, shortness of breath and wheezing.   Cardiovascular: Negative for chest pain, palpitations, orthopnea, leg swelling and PND.  Gastrointestinal: Negative for nausea, vomiting, abdominal pain, diarrhea, constipation and blood in stool.  Genitourinary: Negative for dysuria, urgency, frequency and hematuria.  Musculoskeletal: Negative for falls.  Neurological: Negative for dizziness, tremors, focal weakness and headaches.  Endo/Heme/Allergies: Does not bruise/bleed easily.  Psychiatric/Behavioral: Negative for depression. The patient does not have insomnia.     VITAL SIGNS:  Blood pressure 152/63, pulse 62, temperature 97.6 F (36.4 C), temperature source Oral, resp. rate 18, height 5\' 4"  (1.626 m), weight 105.597 kg (232 lb 12.8 oz), SpO2 96 %.  PHYSICAL EXAMINATION:   GENERAL:  80 y.o.-year-old patient lying in the bed with no acute distress.  EYES: Pupils equal, round, reactive to light and accommodation. No scleral icterus. Extraocular muscles intact.  HEENT: Head atraumatic, normocephalic. Oropharynx and nasopharynx clear.  NECK:  Supple, no jugular venous distention. No thyroid enlargement, no tenderness.  LUNGS: Normal breath sounds bilaterally, no wheezing, rales,rhonchi or crepitation. No use of accessory muscles of respiration.  CARDIOVASCULAR: S1, S2 normal. No murmurs, rubs, or gallops.  ABDOMEN: Soft, nontender, nondistended. Bowel sounds present. No organomegaly or mass.  EXTREMITIES: No pedal edema, cyanosis, or clubbing.  NEUROLOGIC: Cranial nerves II through XII are intact. Muscle strength 5/5 in all extremities. Sensation intact. Gait not checked.  PSYCHIATRIC: The patient is alert and oriented x 3.  SKIN: No obvious rash, lesion, or ulcer.   ORDERS/RESULTS REVIEWED:   CBC  Recent Labs Lab 08/23/15 0731  WBC 7.7  HGB 11.6*  HCT 29.3*  PLT 200  MCV 99.8  MCH 39.7*  MCHC 39.7*  RDW 24.8*  LYMPHSABS 2.3  MONOABS 0.8  EOSABS 0.1  BASOSABS 0.0   ------------------------------------------------------------------------------------------------------------------  Chemistries   Recent Labs Lab 08/23/15 0640 08/24/15 0506  NA 141 141  K 4.7 5.6*  CL 112* 108  CO2 22 27  GLUCOSE 140* 125*  BUN 12 12  CREATININE 0.87 0.93  CALCIUM 8.7* 8.5*  AST 27  --   ALT 16  --   ALKPHOS 117  --   BILITOT 1.4*  --    ------------------------------------------------------------------------------------------------------------------ estimated creatinine clearance is  60.1 mL/min (by C-G formula based on Cr of  0.93). ------------------------------------------------------------------------------------------------------------------ No results for input(s): TSH, T4TOTAL, T3FREE, THYROIDAB in the last 72 hours.  Invalid input(s): FREET3  Cardiac Enzymes  Recent Labs Lab 08/23/15 0640 08/24/15 0506  TROPONINI 0.05* 0.05*   ------------------------------------------------------------------------------------------------------------------ Invalid input(s): POCBNP ---------------------------------------------------------------------------------------------------------------  RADIOLOGY: Dg Chest Port 1 View  08/23/2015  CLINICAL DATA:  Acute onset of shortness of breath. Initial encounter. EXAM: PORTABLE CHEST 1 VIEW COMPARISON:  None. FINDINGS: The lungs are well-aerated. Vascular congestion is noted, with increased interstitial markings, concerning for pulmonary edema. There is no evidence of focal opacification, pleural effusion or pneumothorax. The cardiomediastinal silhouette is mildly enlarged. No acute osseous abnormalities are seen. IMPRESSION: Vascular congestion and mild cardiomegaly, with increased interstitial markings, concerning for pulmonary edema. Electronically Signed   By: Roanna Raider M.D.   On: 08/23/2015 05:54    EKG:  Orders placed or performed during the hospital encounter of 08/23/15  . EKG 12-Lead  . EKG 12-Lead    ASSESSMENT AND PLAN:  Active Problems:   Acute pulmonary edema (HCC)   CHF, acute on chronic (HCC)   CAD (coronary artery disease)   Atrial fibrillation (HCC)   HTN (hypertension)   CVA (cerebral infarction)   CHF (congestive heart failure) (HCC) 1. Acute pulmonary edema due to acute on chronic combined systolic and diastolic congestive heart failure. Continue advanced doses of Lasix as well as nitroglycerin topically, following patient's ins and outs closely. Echocardiogram is observed, wall motion abnormalities concerning and cardiologist questioning  possibly repeating cardiac catheterization, attempting to get medical records from primary cardiology 2. Elevated troponin, likely demand ischemia due to underlying coronary artery disease. Continue Plavix, Lipitor. Lipid panel was observed. LDL 51, still , since patient has severe coronary artery disease, I feel that she would benefit from Lipitor. Appreciate Cardiology input 3. Diabetes mellitus type 2 without complications. Hemoglobin A1c 6.5. Continue sliding scale insulin and her usual outpatient medications as well as diabetic diet, very good control 4. Malignant. Essential hypertension, continue outpatient management, may need to advance medications 5. History of atrial fibrillation, likely paroxysmal. Patient is in sinus rhythm now, continue Eliquis as well as Cardizem and metoprolol. Patient's heart rate is well controlled. Echocardiogram is observed. Appreciate cardiology input , continue  Eliquis  for now, patient is still being managed on Plavix, but I feel that if patient has this difficult to access Critical coronary artery lesion, she may benefit from coronary artery bypass grafting, cardiology recommendations to follow   Management plans discussed with the patient, family and they are in agreement.   DRUG ALLERGIES: No Known Allergies  CODE STATUS:     Code Status Orders        Start     Ordered   08/23/15 1139  Full code   Continuous     08/23/15 1138      TOTAL TIME TAKING CARE OF THIS PATIENT: 45 minutes.    discussed extensively with cardiology and family , coordination of care time 15 minutes  Emelie Newsom M.D on 08/24/2015 at 4:20 PM  Between 7am to 6pm - Pager - [phone]  After 6pm go to [email or web address] - [email or web address] [facility]  [place] [facility]  Office  [phone]  CC: Primary care physician; No PCP Per Patient

## 2015-08-24 NOTE — Progress Notes (Signed)
Patient: Elizabeth Wilkerson / Admit Date: 08/23/2015 / Date of Encounter: 08/24/2015, 10:26 AM   Subjective: No complaints today. Has ambulated throughout her room without issues. Still awaiting [facility]. Called them again, they asked us to fax release a second time. Minimal diuresis. Echo showed EF 45-50%, severe HK of the apicalanterior and inferior myocardium. There was AK of the apical myocardium. GR2DD. Mild MR, left atrium moderately dilated, right atrium moderately dilated, PASP 45 mm Hg.   Review of Systems: Review of Systems  Constitutional: Negative for fever, chills and malaise/fatigue.  Respiratory: Negative for cough.   Cardiovascular: Negative for chest pain and leg swelling.  Neurological: Negative for weakness.    Objective: Telemetry: NSR Physical Exam: Blood pressure 180/82, pulse 62, temperature 98.4 F (36.9 C), temperature source Oral, resp. rate 18, height 5\' 4"  (1.626 m), weight 232 lb 12.8 oz (105.597 kg), SpO2 92 %. Body mass index is 39.94 kg/(m^2). General: Well developed, well nourished, in no acute distress. Head: Normocephalic, atraumatic, sclera non-icteric, no xanthomas, nares are without discharge. Neck: Negative for carotid bruits. JVP not elevated. Lungs: Improved breath sounds bilaterally at the bases. Breathing is unlabored. Heart: RRR S1 S2 without murmurs, rubs, or gallops.  Abdomen: Obese, soft, non-tender, non-distended with normoactive bowel sounds. No rebound/guarding. Extremities: No clubbing or cyanosis. No edema. Distal pedal pulses are 2+ and equal bilaterally. Neuro: Alert and oriented X 3. Moves all extremities spontaneously. Psych:  Responds to questions appropriately with a normal affect.   Intake/Output Summary (Last 24 hours) at 08/24/15 1026 Last data filed at 08/24/15 0747  Gross per 24 hour  Intake    840 ml  Output   1025 ml  Net   -185 ml    Inpatient Medications:  . apixaban  5 mg Oral BID    . atorvastatin  40 mg Oral q1800  . clopidogrel  75 mg Oral Daily  . diltiazem  120 mg Oral Daily  . furosemide  20 mg Intravenous BID  . hydrALAZINE  50 mg Oral 3 times per day  . insulin aspart  0-5 Units Subcutaneous QHS  . insulin aspart  0-9 Units Subcutaneous TID WC  . insulin aspart  4 Units Subcutaneous TID WC  . irbesartan  75 mg Oral Daily  . linagliptin  5 mg Oral Daily  . metoprolol succinate  100 mg Oral Daily  . nitroGLYCERIN  0.5 inch Topical 4 times per day  . nitroGLYCERIN  1 inch Topical Once  . sodium chloride  3 mL Intravenous Q12H  . sodium chloride  3 mL Intravenous Q12H  . vitamin B-12  1,000 mcg Oral Daily   Infusions:    Labs:  Recent Labs  08/23/15 0640 08/24/15 0506  NA 141 141  K 4.7 5.6*  CL 112* 108  CO2 22 27  GLUCOSE 140* 125*  BUN 12 12  CREATININE 0.87 0.93  CALCIUM 8.7* 8.5*    Recent Labs  08/23/15 0640  AST 27  ALT 16  ALKPHOS 117  BILITOT 1.4*  PROT 7.5  ALBUMIN 3.9    Recent Labs  08/23/15 0731  WBC 7.7  NEUTROABS 4.5  HGB 11.6*  HCT 29.3*  MCV 99.8  PLT 200    Recent Labs  08/23/15 0640  TROPONINI 0.05*   Invalid input(s): POCBNP No results for input(s): HGBA1C in the last 72 hours.   Weights: Filed Weights   08/23/15 0453 08/23/15 0459 08/24/15 0525  Weight: 242 lb (  109.77 kg) 242 lb (109.77 kg) 232 lb 12.8 oz (105.597 kg)     Radiology/Studies:  Dg Chest Port 1 View  08/23/2015  CLINICAL DATA:  Acute onset of shortness of breath. Initial encounter. EXAM: PORTABLE CHEST 1 VIEW COMPARISON:  None. FINDINGS: The lungs are well-aerated. Vascular congestion is noted, with increased interstitial markings, concerning for pulmonary edema. There is no evidence of focal opacification, pleural effusion or pneumothorax. The cardiomediastinal silhouette is mildly enlarged. No acute osseous abnormalities are seen. IMPRESSION: Vascular congestion and mild cardiomegaly, with increased interstitial markings,  concerning for pulmonary edema. Electronically Signed   By: Roanna Raider M.D.   On: 08/23/2015 05:54     Assessment and Plan  80 y.o. female with h/o CAD medically managed by cardiac cath 11/2014, PAF on Eliquis, recent right sided stroke, with residual eye vision and memory loss, HTN, DM2, and morbid obesity who presented to [facility] on 10/17 with increasing SOB over the past couple of days. Cardiology was consulted for evaluation of possible acute on chronic ischemic cardiomyopathy.  1. Acute on chronic systolic CHF/ischemic cardiomyopathy: -We have contacted [facility], faxed records release form on 10/17. As of the morning of 10/18 no records have been received. They asked [place] to re-fax the release. This was re-faxed on the morning of 10/18. We need prior records to determine if her wall motion abnormalities are new vs old -If we are unable to get records by the end of the day we may need to contact her cardiologist for a physician-to-physician discussion of her cardiac history  -Increase IV Lasix to 40 mg bid given minimal urine output -Echo as above -Nitro paste -Monitor Ins and Outs  2. Elevated troponin: -Mildly elevated at 0.05, likely supply demand ischemia in the setting of volume overload  -Continue to cycle -Reportedly, per patient and her daughter, recent cardiac cath 11/2014 that was managed medically -It is unclear if the ECG changes are new or old, await prior records   3. CAD: -No angina -Continue Plavix 75 mg daily, Toprol XL 100 mg daily, and valsartan 80 mg daily -On Eliquis in place of aspirin   4. PAF: -Currently in sinus rhythm -Continue Eliquis 5 mg bid -Continue Toprol XL 100 mg daily and Cardizem 120 mg daily -CHADSVASc at least 9 (CHF, HTN, DM, age x 2, stroke, vascular disease, female)   5. History of recent stroke: -Apparently this occurred while on Eliquis -Uncertain if this was true drug failure or not -She is recovering well -Would leave to primary MD long  term treatment option   6. HTN: -Uncontrolled -Increase Lasix as above -Increase hydralazine to 100 mg tid -Continue current medications   Elinor Dodge, PA-C Pager: [phone] 08/24/2015, 10:26 AM

## 2015-08-24 NOTE — Care Management Note (Signed)
Case Management Note  Patient Details  Name: Ronnald RampGlenda Boutwell MRN: [id number] Date of Birth: 01/08/1935  Subjective/Objective:           Spoke with patient's daughter.  Patient had a cva and was in  [facility] for CVA in Sept 2016.  Was transfer to a skilled nursing facility and discharge home 10/14.  Daughter Rinaldo Cloudamela brought [facility] and patient [facility] to [facility]- [phone]  Fax- [phone].  [address]  [place].   She had SN PT OT SLP and social work ordered.  Nursing made initial visit and no other disciplines were able to see because patient was readmitted.  Patient has a walker and shower chair.   At present, anticipate return to daughter's home with resumption of home health.      Patient has an appointment on 10/25 with [facility]CP [facility].  [phone].  Daughter does not remember the name of the physician.  Updated Fleet Contrasachel at [facility] of admission.  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   Action/Plan:   Expected Discharge Date:                  Expected Discharge Plan:     In-House Referral:     Discharge planning Services   order obtained for patient to be ambulated in the halls with assist and walker to prevent decline in functional status  Post Acute Care Choice:   na Choice offered to:   na  DME Arranged:   na DME Agency:   na  HH Arranged:    HH Agency:     Status of Service:     Medicare Important Message Given:    Date Medicare IM Given:    Medicare IM give by:    Date Additional Medicare IM Given:    Additional Medicare Important Message give by:     If discussed at Long Length of Stay Meetings, dates discussed:     Additional Comments:  Eber HongGreene, Evadean Sproule R, RN 08/24/2015, 3:06 PM

## 2015-08-25 ENCOUNTER — Telehealth: Payer: Self-pay

## 2015-08-25 DIAGNOSIS — I5043 Acute on chronic combined systolic (congestive) and diastolic (congestive) heart failure: Principal | ICD-10-CM

## 2015-08-25 DIAGNOSIS — I10 Essential (primary) hypertension: Secondary | ICD-10-CM

## 2015-08-25 LAB — BASIC METABOLIC PANEL
Anion gap: 10 (ref 5–15)
BUN: 13 mg/dL (ref 6–20)
CALCIUM: 8.7 mg/dL — AB (ref 8.9–10.3)
CO2: 27 mmol/L (ref 22–32)
CREATININE: 0.85 mg/dL (ref 0.44–1.00)
Chloride: 106 mmol/L (ref 101–111)
GFR calc non Af Amer: >60 mL/min (ref >60)
Glucose, Bld: 182 mg/dL — ABNORMAL HIGH (ref 65–99)
Potassium: 4.6 mmol/L (ref 3.5–5.1)
SODIUM: 143 mmol/L (ref 135–145)

## 2015-08-25 LAB — GLUCOSE, CAPILLARY
GLUCOSE-CAPILLARY: 182 mg/dL — AB (ref 65–99)
GLUCOSE-CAPILLARY: 167 mg/dL — AB (ref 65–99)
GLUCOSE-CAPILLARY: 141 mg/dL — AB (ref 65–99)
Glucose-Capillary: 123 mg/dL — ABNORMAL HIGH (ref 65–99)

## 2015-08-25 MED ORDER — ISOSORBIDE MONONITRATE ER 30 MG PO TB24: 30.0000 mg | ORAL_TABLET | Freq: Two times a day (BID) | ORAL | Status: DC

## 2015-08-25 MED ORDER — TUBERCULIN PPD 5 UNIT/0.1ML ID SOLN
5.0000 [IU] | Freq: Once | INTRADERMAL | Status: DC
  Administered 2015-08-25: 5 [IU] via INTRADERMAL
  Filled 2015-08-25: qty 0.1

## 2015-08-25 MED ORDER — FUROSEMIDE 20 MG PO TABS: 20.0000 mg | ORAL_TABLET | Freq: Every day | ORAL | Status: DC

## 2015-08-25 MED ORDER — VALSARTAN 80 MG PO TABS: 80.0000 mg | ORAL_TABLET | Freq: Two times a day (BID) | ORAL | Status: AC

## 2015-08-25 MED ORDER — VALSARTAN 80 MG PO TABS
80.0000 mg | ORAL_TABLET | Freq: Two times a day (BID) | ORAL | Status: DC
Start: 2015-08-25 — End: 2015-08-25

## 2015-08-25 MED ORDER — ATORVASTATIN CALCIUM 40 MG PO TABS: 40.0000 mg | ORAL_TABLET | Freq: Every day | ORAL | Status: AC

## 2015-08-25 MED ORDER — HYDRALAZINE HCL 100 MG PO TABS: 100.0000 mg | ORAL_TABLET | Freq: Three times a day (TID) | ORAL | Status: AC

## 2015-08-25 MED ORDER — HYDRALAZINE HCL 100 MG PO TABS: 100.0000 mg | ORAL_TABLET | Freq: Three times a day (TID) | ORAL | Status: DC

## 2015-08-25 MED ORDER — ISOSORBIDE MONONITRATE ER 30 MG PO TB24: 30.0000 mg | ORAL_TABLET | Freq: Two times a day (BID) | ORAL | Status: AC

## 2015-08-25 NOTE — Discharge Instructions (Signed)
[facility] will resume services.  Have provided Rinaldo Cloudamela with the fl2 needed for facility placement so placement efforts can be continued from home.  Home health agency also has a copy of the signed fl2.  TB skin test has been placed and to be read by home health nurse within 48 hours of placement (placed 10/19)

## 2015-08-25 NOTE — Care Management (Signed)
Daughter contacted CM to state that it may not have been a good idea for her to move patient in with her.  It is causing her too much stress. Ms Elizabeth Wilkerson wants to place her mother in an assisted living facility in [place] because that is where all of her physicians are located and she is not able to take patient for these appointments.  Not interested in having care providers transferred to [place] then place in an assisted living [place].   Discussed that patient would be medically stable for discharge today.  It may take several days to compete this process and there is no medical necessity for continued stay.  Discussed that process could be initiated from the hospital but may require the assistance of home health social worker to complete the process.  Ms Elizabeth Wilkerson says she has contacted  two facilities: [facility] [phone]  and [facility].  Faxed referral and FL2  to facilities

## 2015-08-25 NOTE — Discharge Summary (Signed)
[facility] [place] [facility]   PATIENT NAME: Elizabeth RampGlenda Wilkerson    MR#:  [id number]  DATE OF BIRTH:  01/21/1935  DATE OF ADMISSION:  08/23/2015 ADMITTING PHYSICIAN: Katharina Caperima Trung Wenzl, MD  DATE OF DISCHARGE: No discharge date for patient encounter.  PRIMARY CARE PHYSICIAN: No PCP Per Patient     ADMISSION DIAGNOSIS:  Shortness of breath [R06.02] Tachypnea [R06.82] Elevated troponin [R79.89] Acute congestive heart failure, unspecified congestive heart failure type (HCC) [I50.9]  DISCHARGE DIAGNOSIS:  Active Problems:   Acute pulmonary edema (HCC)   CHF, acute on chronic (HCC)   CHF (congestive heart failure) (HCC)   Acute on chronic combined systolic and diastolic CHF (congestive heart failure) (HCC)   CAD (coronary artery disease)   Atrial fibrillation (HCC)   HTN (hypertension)   CVA (cerebral infarction)   SOB (shortness of breath)   Tachypnea   Essential hypertension, malignant   SECONDARY DIAGNOSIS:   Past Medical History  Diagnosis Date  . Hypertension   . PAF (paroxysmal atrial fibrillation) (HCC)     a. on Eliquis  . Diabetes mellitus without complication (HCC)   . CAD (coronary artery disease)     a. medically managed; b. last cardiac cath 11/2014  . Morbid obesity (HCC)   . Stroke (HCC)   . Diabetes mellitus (HCC)   . Memory loss   . Ischemic cardiomyopathy     .pro HOSPITAL COURSE:  patient is a 80 year old female with history of multiple medical problems significant for stroke, A. fib for which she is on Eliquis,  Coronary artery disease, congestive heart failure who presents to the hospital with shortness of breath on exertion as well as lower extremity swelling. Chest x-ray was concerning for congestive heart failure and she was admitted. She was borderline hypoxic. She was initiated on diuretic and her condition improved. She denied any pain  Echocardiogram revealed ejection fraction of 45-50% as well as wall motion  abnormalities. Results from prior echocardiogram were reviewed by cardiologist and felt that patient should have diuretics in her medication list, so patient was initiated and continued on diuretic Lasix. During her hospitalization she diuresed approximately 2.4 L and felt progressively more comfortable. She was noted to have minimal elevation of troponin, no chest pain, which was felt to be demand ischemia by cardiology. No further interventions were recommended, but follow up with primary cardiologist Discussion by problem 1. Acute pulmonary edema due to acute on chronic combined systolic and diastolic congestive heart failure. Continue Lasix , Imdur, advanced dose of hydralazine and valsartan,  following patient's kidney function and potassium levels closely. Echocardiogram showed ejection fraction of 45-50% and wall motion abnormalities but no significant changes since prior echocardiogram, patient was advised to continue follow-up with primary cardiologist, or if she stays in [place] with Dr. Kirke CorinArida, who may want to assess her cardiac [facility] as [facility] 2. Elevated troponin, likely demand ischemia due to underlying coronary artery disease. Continue Plavix, Lipitor. Lipid panel was observed. LDL 51, still , since patient has severe coronary artery disease, I feel that she would benefit from Lipitor. Appreciate Cardiology input 3. Diabetes mellitus type 2 without complications. Hemoglobin A1c 6.5. Continue sliding scale insulin and her usual outpatient medications as well as diabetic diet, good control 4. Malignant essential hypertension, continue outpatient management, now on high-dose of hydralazine/Imdur and valsartan 5. History of atrial fibrillation, likely paroxysmal. Patient is in sinus rhythm now, continue Eliquis as well as Cardizem and metoprolol. Patient's heart rate is  well controlled. Echocardiogram is observed. Appreciate cardiology input , continue Eliquis  patient is to follow-up with your primary cardiologist and make decisions about potentially risky combination of Eliquis and Plavix  DISCHARGE CONDITIONS:   Failure  CONSULTS OBTAINED:  Treatment Team:  Katharina Caper, MD Iran Ouch, MD  DRUG ALLERGIES:  No Known Allergies  DISCHARGE MEDICATIONS:   Current Discharge Medication List    START taking these medications   Details  furosemide (LASIX) 20 MG tablet Take 1 tablet (20 mg total) by mouth daily. Qty: 30 tablet, Refills: 6      CONTINUE these medications which have CHANGED   Details  hydrALAZINE (APRESOLINE) 100 MG tablet Take 1 tablet (100 mg total) by mouth every 8 (eight) hours. Qty: 90 tablet, Refills: 6    valsartan (DIOVAN) 80 MG tablet Take 1 tablet (80 mg total) by mouth 2 (two) times daily. Qty: 60 tablet, Refills: 6      CONTINUE these medications which have NOT CHANGED   Details  apixaban (ELIQUIS) 5 MG TABS tablet Take 5 mg by mouth 2 (two) times daily.    clopidogrel (PLAVIX) 75 MG tablet Take 75 mg by mouth daily.    diltiazem (CARDIZEM CD) 120 MG 24 hr capsule Take 120 mg by mouth daily.    metoprolol succinate (TOPROL-XL) 100 MG 24 hr tablet Take 100 mg by mouth daily.     sitaGLIPtin (JANUVIA) 100 MG tablet Take 100 mg by mouth daily.    vitamin B-12 (CYANOCOBALAMIN) 1000 MCG tablet Take 1,000 mcg by mouth daily.         DISCHARGE INSTRUCTIONS:    Patient is to follow-up with her primary care physician and primary cardiologist as outpatient  If you experience worsening of your admission symptoms, develop shortness of breath, life threatening emergency, suicidal or homicidal thoughts you must seek medical attention immediately by calling 911 or calling your MD immediately  if symptoms less severe.  You Must read complete instructions/literature along with all the possible adverse reactions/side effects for all the Medicines you take and that have been prescribed to you. Take any new  Medicines after you have completely understood and accept all the possible adverse reactions/side effects.   Please note  You were cared for by a hospitalist during your hospital stay. If you have any questions about your discharge medications or the care you received while you were in the hospital after you are discharged, you can call the unit and asked to speak with the hospitalist on call if the hospitalist that took care of you is not available. Once you are discharged, your primary care physician will handle any further medical issues. Please note that NO REFILLS for any discharge medications will be authorized once you are discharged, as it is imperative that you return to your primary care physician (or establish a relationship with a primary care physician if you do not have one) for your aftercare needs so that they can reassess your need for medications and monitor your lab values.    Today   CHIEF COMPLAINT:   Chief Complaint  Patient presents with  . Shortness of Breath    HISTORY OF PRESENT ILLNESS:  Elizabeth Wilkerson  is a 80 y.o. female with a known history of stroke, A. fib for which she is on Eliquis,  Coronary artery disease, congestive heart failure who presents to the hospital with shortness of breath on exertion as well as lower extremity swelling. Chest x-ray was concerning for congestive  heart failure and she was admitted. She was borderline hypoxic. She was initiated on diuretic and her condition improved. She denied any pain  Echocardiogram revealed ejection fraction of 45-50% as well as wall motion abnormalities. Results from prior echocardiogram were reviewed by cardiologist and felt that patient should have diuretics in her medication list, so patient was initiated and continued on diuretic Lasix. During her hospitalization she diuresed approximately 2.4 L and felt progressively more comfortable. She was noted to have minimal elevation of troponin, no chest pain, which was  felt to be demand ischemia by cardiology. No further interventions were recommended, but follow up with primary cardiologist Discussion by problem 1. Acute pulmonary edema due to acute on chronic combined systolic and diastolic congestive heart failure. Continue Lasix , Imdur, advanced dose of hydralazine and valsartan,  following patient's kidney function and potassium levels closely. Echocardiogram showed ejection fraction of 45-50% and wall motion abnormalities but no significant changes since prior echocardiogram, patient was advised to continue follow-up with primary cardiologist, or if she stays in [place] with Dr. Kirke Corin, who may want to assess her cardiac catheterization from [facility] as outpatient 2. Elevated troponin, likely demand ischemia due to underlying coronary artery disease. Continue Plavix, Lipitor. Lipid panel was observed. LDL 51, still , since patient has severe coronary artery disease, I feel that she would benefit from Lipitor. Appreciate Cardiology input 3. Diabetes mellitus type 2 without complications. Hemoglobin A1c 6.5. Continue sliding scale insulin and her usual outpatient medications as well as diabetic diet, good control 4. Malignant essential hypertension, continue outpatient management, now on high-dose of hydralazine/Imdur and valsartan 5. History of atrial fibrillation, likely paroxysmal. Patient is in sinus rhythm now, continue Eliquis as well as Cardizem and metoprolol. Patient's heart rate is well controlled. Echocardiogram is observed. Appreciate cardiology input , continue Eliquis patient is to follow-up with your primary cardiologist and make decisions about potentially risky combination of Eliquis and Plavix     VITAL SIGNS:  Blood pressure 152/66, pulse 69, temperature 98.7 F (37.1 C), temperature source Oral, resp. rate 16, height  (1.626 m), weight 103.964 kg (229 lb 3.2 oz), SpO2 94 %.  I/O:   Intake/Output Summary (Last 24 hours) at  08/25/15 1031 Last data filed at 08/25/15 0309  Gross per 24 hour  Intake    240 ml  Output   2425 ml  Net  -2185 ml    PHYSICAL EXAMINATION:  GENERAL:  80 y.o.-year-old patient lying in the bed with no acute distress.  EYES: Pupils equal, round, reactive to light and accommodation. No scleral icterus. Extraocular muscles intact.  HEENT: Head atraumatic, normocephalic. Oropharynx and nasopharynx clear.  NECK:  Supple, no jugular venous distention. No thyroid enlargement, no tenderness.  LUNGS: Normal breath sounds bilaterally, no wheezing, rales,rhonchi or crepitation. No use of accessory muscles of respiration.  CARDIOVASCULAR: S1, S2 normal. No murmurs, rubs, or gallops.  ABDOMEN: Soft, non-tender, non-distended. Bowel sounds present. No organomegaly or mass.  EXTREMITIES: No pedal edema, cyanosis, or clubbing.  NEUROLOGIC: Cranial nerves II through XII are intact. Muscle strength 5/5 in all extremities. Sensation intact. Gait not checked.  PSYCHIATRIC: The patient is alert and oriented x 3.  SKIN: No obvious rash, lesion, or ulcer.   DATA REVIEW:   CBC  Recent Labs Lab 08/23/15 0731  WBC 7.7  HGB 11.6*  HCT 29.3*  PLT 200    Chemistries   Recent Labs Lab 08/23/15 0640  08/25/15 0515  NA 141  < > 143  K 4.7  < > 4.6  CL 112*  < > 106  CO2 22  < > 27  GLUCOSE 140*  < > 182*  BUN 12  < > 13  CREATININE 0.87  < > 0.85  CALCIUM 8.7*  < > 8.7*  AST 27  --   --   ALT 16  --   --   ALKPHOS 117  --   --   BILITOT 1.4*  --   --   < > = values in this interval not displayed.  Cardiac Enzymes  Recent Labs Lab 08/24/15 0506  TROPONINI 0.05*    Microbiology Results  No results found for this or any previous visit.  RADIOLOGY:  No results found.  EKG:   Orders placed or performed during the hospital encounter of 08/23/15  . EKG 12-Lead  . EKG 12-Lead      Management plans discussed with the patient, family and they are in agreement.  CODE STATUS:      Code Status Orders        Start     Ordered   08/23/15 1139  Full code   Continuous     08/23/15 1138      TOTAL TIME TAKING CARE OF THIS PATIENT: 40 minutes.    Katharina Caper M.D on 08/25/2015 at 10:31 AM  Between 7am to 6pm - Pager - [phone]  After 6pm go to [email or web address] - password [email or web address] [facility]  [place] [facility]  Office  [phone]  CC: Primary care physician; No PCP Per Patient

## 2015-08-25 NOTE — Telephone Encounter (Signed)
Patient contacted regarding discharge [facility] on 08/25/15.  Patient understands to follow up with Ward Givenshris Berge, NP on 09/15/15 at 9:30 at [facility]. Patient understands discharge instructions? yes Patient understands medications and regiment? yes Patient understands to bring all medications to this visit? yes  Spoke w/ pt's daughter.  She reports that pt is in the process of being discharged to an assisted living facility.  She states "the situation is not good" but does not elaborate.

## 2015-08-25 NOTE — Care Management (Signed)
Patient will discharge home today in the care of her daughter.  Signed FL2s with pasarr have been faded to the 2 requested facilities.  patient has had PPD placed on left arm and is to be read within 48 hours.  Obtained order to  resume home health SN PT OT Aide and SW.  Spoke with Fleet Contrasachel at [facility] and informed of the plan and that needed to continue to pursue placement from home.  Daughter is in agreement.  Will provided Rinaldo Cloudamela with the original fl2 and faxed this information to [facility].   Fax numbers for for ALF:  [facility]- [phone]  [facility] [phone]

## 2015-08-25 NOTE — Progress Notes (Signed)
Patient: Elizabeth Wilkerson / Admit Date: 08/23/2015 / Date of Encounter: 08/25/2015, 10:33 AM   Subjective: No complaints today.  Has ambulated throughout her room without issues.   records from [facility] reviewed, little change in echo, wall motion abnormality Cath in 1/16 with severe 2 vessel dz. Details unclear from report.  Daughter aware that decision was made for medical management Currently with no angina or SOB Started on imdur BID yesterday, BP improved  Review of Systems: Review of Systems  Constitutional: Negative for fever, chills and malaise/fatigue.  Respiratory: Negative for cough.   Cardiovascular: Negative for chest pain and leg swelling.  Neurological: Negative for weakness.    Objective: Telemetry: NSR Physical Exam: Blood pressure 152/66, pulse 69, temperature 98.7 F (37.1 C), temperature source Oral, resp. rate 16, height  (1.626 m), weight 229 lb 3.2 oz (103.964 kg), SpO2 94 %. Body mass index is 39.32 kg/(m^2). General: Well developed, well nourished, in no acute distress. Head: Normocephalic, atraumatic, sclera non-icteric, no xanthomas, nares are without discharge. Neck: Negative for carotid bruits. JVP not elevated. Lungs: Improved breath sounds bilaterally at the bases. Breathing is unlabored. Heart: RRR S1 S2 without murmurs, rubs, or gallops.  Abdomen: Obese, soft, non-tender, non-distended with normoactive bowel sounds. No rebound/guarding. Extremities: No clubbing or cyanosis. No edema. Distal pedal pulses are 2+ and equal bilaterally. Neuro: Alert and oriented X 3. Moves all extremities spontaneously. Psych:  Responds to questions appropriately with a normal affect.   Intake/Output Summary (Last 24 hours) at 08/25/15 1033 Last data filed at 08/25/15 0309  Gross per 24 hour  Intake    240 ml  Output   2125 ml  Net  -1885 ml    Inpatient Medications:  . apixaban  5 mg Oral BID  . atorvastatin  40 mg Oral q1800  .  clopidogrel  75 mg Oral Daily  . diltiazem  120 mg Oral Daily  . furosemide  40 mg Intravenous BID  . hydrALAZINE  100 mg Oral 3 times per day  . insulin aspart  0-5 Units Subcutaneous QHS  . insulin aspart  0-9 Units Subcutaneous TID WC  . insulin aspart  4 Units Subcutaneous TID WC  . irbesartan  75 mg Oral Daily  . isosorbide mononitrate  30 mg Oral BID  . linagliptin  5 mg Oral Daily  . metoprolol succinate  100 mg Oral Daily  . sodium chloride  3 mL Intravenous Q12H  . sodium chloride  3 mL Intravenous Q12H  . vitamin B-12  1,000 mcg Oral Daily   Infusions:    Labs:  Recent Labs  08/24/15 0506 08/25/15 0515  NA 141 143  K 5.6* 4.6  CL 108 106  CO2 27 27  GLUCOSE 125* 182*  BUN 12 13  CREATININE 0.93 0.85  CALCIUM 8.5* 8.7*    Recent Labs  08/23/15 0640  AST 27  ALT 16  ALKPHOS 117  BILITOT 1.4*  PROT 7.5  ALBUMIN 3.9    Recent Labs  08/23/15 0731  WBC 7.7  NEUTROABS 4.5  HGB 11.6*  HCT 29.3*  MCV 99.8  PLT 200    Recent Labs  08/23/15 0640 08/24/15 0506  TROPONINI 0.05* 0.05*   Invalid input(s): POCBNP  Recent Labs  08/24/15 0506  HGBA1C 6.5*     Weights: Filed Weights   08/23/15 0459 08/24/15 0525 08/25/15 0439  Weight: 242 lb (109.77 kg) 232 lb 12.8 oz (105.597 kg) 229 lb 3.2 oz (103.964  kg)     Radiology/Studies:  Dg Chest Port 1 View  08/23/2015  CLINICAL DATA:  Acute onset of shortness of breath. Initial encounter. EXAM: PORTABLE CHEST 1 VIEW COMPARISON:  None. FINDINGS: The lungs are well-aerated. Vascular congestion is noted, with increased interstitial markings, concerning for pulmonary edema. There is no evidence of focal opacification, pleural effusion or pneumothorax. The cardiomediastinal silhouette is mildly enlarged. No acute osseous abnormalities are seen. IMPRESSION: Vascular congestion and mild cardiomegaly, with increased interstitial markings, concerning for pulmonary edema. Electronically Signed   By: Roanna RaiderJeffery   Chang M.D.   On: 08/23/2015 05:54     Assessment and Plan  80 y.o. female with h/o CAD medically managed by cardiac cath 11/2014, PAF on Eliquis, recent right sided stroke, with residual eye vision and memory loss, HTN, DM2, and morbid obesity who [facility] with increasing SOB over the past couple of days. Cardiology was consulted for evaluation of possible acute on chronic ischemic cardiomyopathy.  1. Acute on chronic systolic CHF/ischemic cardiomyopathy: EF unchanged, mildly reducedd --Would continue current meds,  imdur BID added  2. Elevated troponin: -Mildly elevated at 0.05, likely supply demand ischemia in the setting of volume overload   cardiac cath 11/2014 that was managed medically  3. CAD: -Continue Plavix 75 mg daily, Toprol XL 100 mg daily, and irbesartan  -On Eliquis in place of aspirin   4. PAF: -Currently in sinus rhythm -Continue Eliquis 5 mg bid -Continue Toprol XL 100 mg daily and Cardizem 120 mg daily, metoprolol -CHADSVASc at least 9 (CHF, HTN, DM, age x 2, stroke, vascular disease, female)   5. History of recent stroke: -Apparently this occurred while on Eliquis -Uncertain if this was true drug failure or not -Would leave to primary MD long term treatment option   6. HTN: Lasix 20 mg daily at d/c -Increase hydralazine to 100 mg tid imdur 30 BID   Signed, Dossie Arbourim Resha Filippone, MD [facility]  08/25/2015, 10:33 AM

## 2015-08-25 NOTE — Telephone Encounter (Signed)
-----   Message from Coralee RudSabrina F Gilley sent at 08/25/2015 10:42 AM EDT ----- Regarding: tcm/ph 11/9 9:30 Ward Givenshris Berge, NP

## 2015-08-25 NOTE — Care Management Important Message (Signed)
Important Message  Patient Details  Name: Elizabeth RampGlenda Wilkerson MRN: [id number] Date of Birth: 12/29/1934   Medicare Important Message Given:  Yes-second notification given    Olegario MessierKathy A Allmond 08/25/2015, 10:03 AM

## 2015-09-15 ENCOUNTER — Encounter: Payer: Medicare HMO | Admitting: Nurse Practitioner

## 2015-09-16 ENCOUNTER — Encounter: Payer: Medicare HMO | Admitting: Nurse Practitioner

## 2015-10-30 ENCOUNTER — Encounter: Payer: Self-pay | Admitting: Emergency Medicine

## 2015-10-30 ENCOUNTER — Emergency Department: Payer: Medicare HMO

## 2015-10-30 ENCOUNTER — Inpatient Hospital Stay
Admission: EM | Admit: 2015-10-30 | Discharge: 2015-10-31 | DRG: 292 | Disposition: A | Discharge status: Home / Self Care | Payer: Medicare HMO | Attending: Internal Medicine | Admitting: Internal Medicine

## 2015-10-30 DIAGNOSIS — I255 Ischemic cardiomyopathy: Secondary | ICD-10-CM | POA: Diagnosis present

## 2015-10-30 DIAGNOSIS — R413 Other amnesia: Secondary | ICD-10-CM | POA: Diagnosis present

## 2015-10-30 DIAGNOSIS — I2510 Atherosclerotic heart disease of native coronary artery without angina pectoris: Secondary | ICD-10-CM | POA: Diagnosis present

## 2015-10-30 DIAGNOSIS — I509 Heart failure, unspecified: Secondary | ICD-10-CM | POA: Diagnosis not present

## 2015-10-30 DIAGNOSIS — Z6841 Body Mass Index (BMI) 40.0 and over, adult: Secondary | ICD-10-CM | POA: Diagnosis not present

## 2015-10-30 DIAGNOSIS — I480 Paroxysmal atrial fibrillation: Secondary | ICD-10-CM | POA: Diagnosis present

## 2015-10-30 DIAGNOSIS — I5023 Acute on chronic systolic (congestive) heart failure: Secondary | ICD-10-CM | POA: Diagnosis present

## 2015-10-30 DIAGNOSIS — I482 Chronic atrial fibrillation: Secondary | ICD-10-CM | POA: Diagnosis present

## 2015-10-30 DIAGNOSIS — E119 Type 2 diabetes mellitus without complications: Secondary | ICD-10-CM | POA: Diagnosis present

## 2015-10-30 DIAGNOSIS — I110 Hypertensive heart disease with heart failure: Secondary | ICD-10-CM | POA: Diagnosis not present

## 2015-10-30 DIAGNOSIS — Z79899 Other long term (current) drug therapy: Secondary | ICD-10-CM | POA: Diagnosis not present

## 2015-10-30 DIAGNOSIS — Z8673 Personal history of transient ischemic attack (TIA), and cerebral infarction without residual deficits: Secondary | ICD-10-CM

## 2015-10-30 DIAGNOSIS — Z7902 Long term (current) use of antithrombotics/antiplatelets: Secondary | ICD-10-CM

## 2015-10-30 DIAGNOSIS — R0902 Hypoxemia: Secondary | ICD-10-CM

## 2015-10-30 LAB — CBC WITH DIFFERENTIAL/PLATELET
Basophils Absolute: 0.1 10*3/uL (ref 0–0.1)
Basophils Relative: 1 %
EOS ABS: 0.1 10*3/uL (ref 0–0.7)
Eosinophils Relative: 2 %
HEMATOCRIT: 34.9 % — AB (ref 35.0–47.0)
HEMOGLOBIN: 12.2 g/dL (ref 12.0–16.0)
LYMPHS ABS: 1.9 10*3/uL (ref 1.0–3.6)
MCH: 31.3 pg (ref 26.0–34.0)
MCHC: 34.9 g/dL (ref 32.0–36.0)
MCV: 89.5 fL (ref 80.0–100.0)
Monocytes Absolute: 0.9 10*3/uL (ref 0.2–0.9)
Neutro Abs: 4.9 10*3/uL (ref 1.4–6.5)
Platelets: 166 10*3/uL (ref 150–440)
RBC: 3.9 MIL/uL (ref 3.80–5.20)
RDW: 17.5 % — ABNORMAL HIGH (ref 11.5–14.5)
WBC: 8 10*3/uL (ref 3.6–11.0)

## 2015-10-30 LAB — BASIC METABOLIC PANEL
ANION GAP: 3 — AB (ref 5–15)
BUN: 13 mg/dL (ref 6–20)
CHLORIDE: 111 mmol/L (ref 101–111)
CO2: 27 mmol/L (ref 22–32)
Calcium: 8.4 mg/dL — ABNORMAL LOW (ref 8.9–10.3)
Creatinine, Ser: 0.95 mg/dL (ref 0.44–1.00)
GFR calc Af Amer: >60 mL/min (ref >60)
GFR, EST NON AFRICAN AMERICAN: 56 mL/min — AB (ref >60)
GLUCOSE: 139 mg/dL — AB (ref 65–99)
POTASSIUM: 4.6 mmol/L (ref 3.5–5.1)
Sodium: 141 mmol/L (ref 135–145)

## 2015-10-30 LAB — TROPONIN I: Troponin I: 0.03 ng/mL (ref <0.031)

## 2015-10-30 LAB — BRAIN NATRIURETIC PEPTIDE: B Natriuretic Peptide: 1609 pg/mL — ABNORMAL HIGH (ref 0.0–100.0)

## 2015-10-30 LAB — GLUCOSE, CAPILLARY: GLUCOSE-CAPILLARY: 187 mg/dL — AB (ref 65–99)

## 2015-10-30 MED ORDER — HYDRALAZINE HCL 50 MG PO TABS
100.0000 mg | ORAL_TABLET | Freq: Three times a day (TID) | ORAL | Status: DC
  Administered 2015-10-30 – 2015-10-31 (×3): 100 mg via ORAL
  Filled 2015-10-30 (×3): qty 2

## 2015-10-30 MED ORDER — INSULIN ASPART 100 UNIT/ML ~~LOC~~ SOLN
0.0000 [IU] | Freq: Every day | SUBCUTANEOUS | Status: DC
  Filled 2015-10-30: qty 3

## 2015-10-30 MED ORDER — FUROSEMIDE 20 MG PO TABS: 20.0000 mg | ORAL_TABLET | Freq: Every day | ORAL | Status: AC

## 2015-10-30 MED ORDER — DILTIAZEM HCL ER COATED BEADS 120 MG PO CP24
120.0000 mg | ORAL_CAPSULE | Freq: Every day | ORAL | Status: DC
Start: 2015-10-31 — End: 2015-10-31

## 2015-10-30 MED ORDER — SODIUM CHLORIDE 0.9 % IJ SOLN
3.0000 mL | Freq: Two times a day (BID) | INTRAMUSCULAR | Status: DC
  Administered 2015-10-30: 3 mL via INTRAVENOUS

## 2015-10-30 MED ORDER — ACETAMINOPHEN 325 MG PO TABS: 650.0000 mg | ORAL_TABLET | ORAL | Status: DC | PRN

## 2015-10-30 MED ORDER — INSULIN ASPART 100 UNIT/ML ~~LOC~~ SOLN
0.0000 [IU] | Freq: Three times a day (TID) | SUBCUTANEOUS | Status: DC
  Filled 2015-10-30: qty 2

## 2015-10-30 MED ORDER — METOPROLOL SUCCINATE ER 100 MG PO TB24: 100.0000 mg | ORAL_TABLET | Freq: Every day | ORAL | Status: DC

## 2015-10-30 MED ORDER — ASPIRIN EC 81 MG PO TBEC: 81.0000 mg | DELAYED_RELEASE_TABLET | Freq: Every day | ORAL | Status: DC

## 2015-10-30 MED ORDER — ONDANSETRON HCL 4 MG/2ML IJ SOLN: 4.0000 mg | Freq: Four times a day (QID) | INTRAMUSCULAR | Status: DC | PRN

## 2015-10-30 MED ORDER — FAMOTIDINE 20 MG PO TABS: 20.0000 mg | ORAL_TABLET | Freq: Every day | ORAL | Status: DC

## 2015-10-30 MED ORDER — SODIUM CHLORIDE 0.9 % IV SOLN: 250.0000 mL | INTRAVENOUS | Status: DC | PRN

## 2015-10-30 MED ORDER — ISOSORBIDE MONONITRATE ER 30 MG PO TB24
30.0000 mg | ORAL_TABLET | Freq: Two times a day (BID) | ORAL | Status: DC
  Administered 2015-10-30: 30 mg via ORAL
  Filled 2015-10-30: qty 1

## 2015-10-30 MED ORDER — FUROSEMIDE 40 MG PO TABS
20.0000 mg | ORAL_TABLET | Freq: Once | ORAL | Status: AC
  Administered 2015-10-30: 20 mg via ORAL
  Filled 2015-10-30: qty 1

## 2015-10-30 MED ORDER — APIXABAN 5 MG PO TABS: 5.0000 mg | ORAL_TABLET | Freq: Every day | ORAL | Status: DC

## 2015-10-30 MED ORDER — APIXABAN 5 MG PO TABS
5.0000 mg | ORAL_TABLET | Freq: Once | ORAL | Status: AC
  Administered 2015-10-30: 5 mg via ORAL
  Filled 2015-10-30: qty 1

## 2015-10-30 MED ORDER — SODIUM CHLORIDE 0.9 % IJ SOLN: 3.0000 mL | INTRAMUSCULAR | Status: DC | PRN

## 2015-10-30 MED ORDER — VITAMIN B-12 1000 MCG PO TABS: 1000.0000 ug | ORAL_TABLET | Freq: Every day | ORAL | Status: DC

## 2015-10-30 MED ORDER — IRBESARTAN 75 MG PO TABS: 75.0000 mg | ORAL_TABLET | Freq: Every day | ORAL | Status: DC

## 2015-10-30 MED ORDER — FUROSEMIDE 10 MG/ML IJ SOLN
20.0000 mg | Freq: Two times a day (BID) | INTRAMUSCULAR | Status: DC
  Administered 2015-10-30: 20 mg via INTRAVENOUS
  Filled 2015-10-30: qty 2

## 2015-10-30 MED ORDER — ATORVASTATIN CALCIUM 20 MG PO TABS: 40.0000 mg | ORAL_TABLET | Freq: Every day | ORAL | Status: DC

## 2015-10-30 MED ORDER — CLOPIDOGREL BISULFATE 75 MG PO TABS
75.0000 mg | ORAL_TABLET | Freq: Every day | ORAL | Status: DC
Start: 2015-10-31 — End: 2015-10-31

## 2015-10-30 NOTE — H&P (Signed)
[facility] [place] [facility]   PATIENT NAME: Elizabeth RampGlenda Wilkerson    MR#:  [id number]  DATE OF BIRTH:  10/27/1935  DATE OF ADMISSION:  10/30/2015  PRIMARY CARE PHYSICIAN: No PCP Per Patient   REQUESTING/REFERRING PHYSICIAN: Dr. Shaune PollackLord  CHIEF COMPLAINT:   Shortness of breath and swelling in her feet HISTORY OF PRESENT ILLNESS:  Elizabeth Wilkerson  is a 80 y.o. female with a known history of paroxysmal atrial fibrillation, on eliquis, coronary artery disease, essential hypertension and ischemic cardiomyopathy is presenting to the ED with a chief complaint of 3-4 day history of progressively worsening shortness of breath associated with lower extremity edema. Chest x-ray has revealed pulmonary venous congestion. Patient was given Lasix and hospitalist team is called to admit the patient.  PAST MEDICAL HISTORY:   Past Medical History  Diagnosis Date  . Hypertension   . PAF (paroxysmal atrial fibrillation) (HCC)     a. on Eliquis  . Diabetes mellitus without complication (HCC)   . CAD (coronary artery disease)     a. medically managed; b. last cardiac cath 11/2014  . Morbid obesity (HCC)   . Stroke (HCC)   . Diabetes mellitus (HCC)   . Memory loss   . Ischemic cardiomyopathy     PAST SURGICAL HISTOIRY:  History reviewed. No pertinent past surgical history.  SOCIAL HISTORY:   Social History  Substance Use Topics  . Smoking status: Never Smoker   . Smokeless tobacco: Not on file  . Alcohol Use: No    FAMILY HISTORY:  No family history on file.  DRUG ALLERGIES:  No Known Allergies  REVIEW OF SYSTEMS:  CONSTITUTIONAL: No fever, fatigue or weakness.  EYES: No blurred or double vision.  EARS, NOSE, AND THROAT: No tinnitus or ear pain.  RESPIRATORY:   reporting dry cough, shortness of breath,  Denies wheezing or hemoptysis.  CARDIOVASCULAR: No chest pain, orthopnea, edema.  GASTROINTESTINAL: No nausea, vomiting, diarrhea or abdominal pain.   GENITOURINARY: No dysuria, hematuria.  ENDOCRINE: No polyuria, nocturia,  HEMATOLOGY: No anemia, easy bruising or bleeding SKIN: No rash or lesion. MUSCULOSKELETAL: No joint pain or arthritis.   NEUROLOGIC: No tingling, numbness, weakness.  PSYCHIATRY: No anxiety or depression.   MEDICATIONS AT HOME:   Prior to Admission medications   Medication Sig Start Date End Date Taking? Authorizing Provider  apixaban (ELIQUIS) 5 MG TABS tablet Take 5 mg by mouth daily.    Yes Historical Provider, MD  clopidogrel (PLAVIX) 75 MG tablet Take 75 mg by mouth daily.   Yes Historical Provider, MD  diltiazem (CARDIZEM CD) 120 MG 24 hr capsule Take 120 mg by mouth daily.   Yes Historical Provider, MD  furosemide (LASIX) 20 MG tablet Take 1 tablet (20 mg total) by mouth daily. 08/25/15  Yes Katharina Caperima Vaickute, MD  hydrALAZINE (APRESOLINE) 100 MG tablet Take 1 tablet (100 mg total) by mouth every 8 (eight) hours. 08/25/15  Yes Katharina Caperima Vaickute, MD  metoprolol succinate (TOPROL-XL) 100 MG 24 hr tablet Take 100 mg by mouth daily.    Yes Historical Provider, MD  ranitidine (ZANTAC) 150 MG tablet Take 150 mg by mouth 2 (two) times daily.   Yes Historical Provider, MD  sitaGLIPtin (JANUVIA) 100 MG tablet Take 100 mg by mouth daily.   Yes Historical Provider, MD  valsartan (DIOVAN) 80 MG tablet Take 1 tablet (80 mg total) by mouth 2 (two) times daily. 08/25/15  Yes Katharina Caperima Vaickute, MD  vitamin B-12 (CYANOCOBALAMIN) 1000 MCG tablet  Take 1,000 mcg by mouth daily.   Yes Historical Provider, MD  atorvastatin (LIPITOR) 40 MG tablet Take 1 tablet (40 mg total) by mouth daily at 6 PM. 08/25/15   Katharina Caper, MD  furosemide (LASIX) 20 MG tablet Take 1 tablet (20 mg total) by mouth daily. 10/30/15   Governor Rooks, MD  isosorbide mononitrate (IMDUR) 30 MG 24 hr tablet Take 1 tablet (30 mg total) by mouth 2 (two) times daily. 08/25/15   Katharina Caper, MD      VITAL SIGNS:  Blood pressure 129/57, pulse 64, temperature 97.9 F (36.6  C), temperature source Oral, resp. rate 15, height  (1.626 m), weight 106.595 kg (235 lb), SpO2 96 %.  PHYSICAL EXAMINATION:  GENERAL:  80 y.o.-year-old patient lying in the bed with no acute distress.  EYES: Pupils equal, round, reactive to light and accommodation. No scleral icterus. Extraocular muscles intact.  HEENT: Head atraumatic, normocephalic. Oropharynx and nasopharynx clear.  NECK:  Supple, no jugular venous distention. No thyroid enlargement, no tenderness.  LUNGS: Normal breath sounds bilaterally, no wheezing, positive  rales,rhonchi  no crepitation. No use of accessory muscles of respiration.  CARDIOVASCULAR: irregularly irregular  No murmurs, rubs, or gallops.  ABDOMEN: Soft, nontender, nondistended. Bowel sounds present. No organomegaly or mass.  EXTREMITIES: No pedal edema, cyanosis, or clubbing.  NEUROLOGIC: Cranial nerves II through XII are intact. Muscle strength 5/5 in all extremities. Sensation intact. Gait not checked.  PSYCHIATRIC: The patient is alert and oriented x 3.  SKIN: No obvious rash, lesion, or ulcer.   LABORATORY PANEL:   CBC  Recent Labs Lab 10/30/15 1507  WBC 8.0  HGB 12.2  HCT 34.9*  PLT 166   ------------------------------------------------------------------------------------------------------------------  Chemistries   Recent Labs Lab 10/30/15 1507  NA 141  K 4.6  CL 111  CO2 27  GLUCOSE 139*  BUN 13  CREATININE 0.95  CALCIUM 8.4*   ------------------------------------------------------------------------------------------------------------------  Cardiac Enzymes  Recent Labs Lab 10/30/15 1507  TROPONINI 0.03   ------------------------------------------------------------------------------------------------------------------  RADIOLOGY:  Dg Chest Port 1 View  10/30/2015  CLINICAL DATA:  Shortness of breath and wheezing. EXAM: PORTABLE CHEST 1 VIEW COMPARISON:  08/23/2015 FINDINGS: Cardiomediastinal silhouette is  enlarged. Mediastinal contours appear intact. There is no evidence of focal airspace consolidation, pleural effusion or pneumothorax. There is persistent prominence of the vascular markings which may be seen with pulmonary vascular congestion. Osseous structures are without acute abnormality. Soft tissues are grossly normal. IMPRESSION: Enlarged cardiac silhouette with pulmonary vascular congestion. Electronically Signed   By: Ted Mcalpine M.D.   On: 10/30/2015 15:29    EKG:   Orders placed or performed during the hospital encounter of 10/30/15  . EKG 12-Lead  . EKG 12-Lead    IMPRESSION AND PLAN:     1. acute respiratory distress secondary to pulmonary venous condition from acute CHF with ischemic cardiomyopathy Provide Lasix 20 mg IV every 12 hours Daily weight monitoring, intake and output Heart healthy diet Continue her home medications aspirin 81 mg, Toprol, ARB, Imdur and statin Patient is requesting to be discharged home in a.m. Cycle cardiac biomarkers   2. Chronic atrial fibrillation-currently rate controlled Continue eliquis and beta blocker  3. Essential hypertension Continue home medications Cardizem and, hydralazine, Lasix and beta blocker  4. Diabetes mellitus type 2 Hold home medications and provide sliding scale insulin while patient is in the hospital  DVT prophylaxis with ELIQUIS  All the records are reviewed and case discussed with ED provider. Management plans discussed  with the patient, family and they are in agreement.  CODE STATUS: fc  TOTAL TIME TAKING CARE OF THIS PATIENT: 45  minutes.    Ramonita Lab M.D on 10/30/2015 at 8:45 PM  Between 7am to 6pm - Pager - [phone]  After 6pm go to [email or web address] - password [email or web address] [facility]  [place] [facility]  Office  [phone]  CC: Primary care physician; No PCP Per Patient

## 2015-10-30 NOTE — Progress Notes (Signed)
Patient is a poor historian. Nurse is unable to complete admission doc due to patient inability to answer questions. Will follow up with day shift to complete if daughter comes tomorrow.

## 2015-10-30 NOTE — ED Notes (Signed)
Assisted patient in ambulation in order to watch O2 sats.  Patient sating originally at 93% and decreased to 88% with ambulation.  MD advised of information.

## 2015-10-30 NOTE — ED Notes (Signed)
C/o bilateral foot swelling x a couple of days, pt daughter states the left foot is worse than the right, states pt stays at a nursing facility and she is supposed to be wearing ted hose but has been refusing to wear them, daughter states pt also has been having some sob, hx of CHG

## 2015-10-30 NOTE — ED Provider Notes (Signed)
[facility] Emergency Department Provider Note   ____________________________________________  Time seen:  I have reviewed the triage vital signs and the triage nursing note.  HISTORY  Chief Complaint Foot Swelling   Historian Patient and daughter  HPI Elizabeth Wilkerson is a 80 y.o. female with a history of paroxysmal A. fib, hypertension, coronary artery disease, and ischemic cardiac myopathy, is here for increased lower extremity edema over the past 3-4 days and possibly mild increased shortness of breath. No chest pain. No fevers. No coughing.  She does take furosemide 20 mg daily. She is on blood thinner, Eloquis.  Walking around makes the shortness of breath slightly worse. She is new to the area from [place] and does not have primary care physician here. She has been referred to follow-up with cardiology, but does not have a cardiologist here either.    Past Medical History  Diagnosis Date  . Hypertension   . PAF (paroxysmal atrial fibrillation) (HCC)     a. on Eliquis  . Diabetes mellitus without complication (HCC)   . CAD (coronary artery disease)     a. medically managed; b. last cardiac cath 11/2014  . Morbid obesity (HCC)   . Stroke (HCC)   . Diabetes mellitus (HCC)   . Memory loss   . Ischemic cardiomyopathy     Patient Active Problem List   Diagnosis Date Noted  . Essential hypertension, malignant 08/25/2015  . Acute on chronic combined systolic and diastolic CHF (congestive heart failure) (HCC)   . SOB (shortness of breath)   . Tachypnea   . CVA (cerebral infarction) 08/23/2015  . CAD (coronary artery disease) 08/23/2015  . Atrial fibrillation (HCC) 08/23/2015  . HTN (hypertension) 08/23/2015  . Acute pulmonary edema (HCC) 08/23/2015  . CHF, acute on chronic (HCC) 08/23/2015  . CHF (congestive heart failure) (HCC) 08/23/2015    History reviewed. No pertinent past surgical history.  Current Outpatient Rx  Name  Route  Sig   Dispense  Refill  . apixaban (ELIQUIS) 5 MG TABS tablet   Oral   Take 5 mg by mouth 2 (two) times daily.         Marland Kitchen. atorvastatin (LIPITOR) 40 MG tablet   Oral   Take 1 tablet (40 mg total) by mouth daily at 6 PM.   30 tablet   6   . clopidogrel (PLAVIX) 75 MG tablet   Oral   Take 75 mg by mouth daily.         Marland Kitchen. diltiazem (CARDIZEM CD) 120 MG 24 hr capsule   Oral   Take 120 mg by mouth daily.         . furosemide (LASIX) 20 MG tablet   Oral   Take 1 tablet (20 mg total) by mouth daily.   30 tablet   6   . furosemide (LASIX) 20 MG tablet   Oral   Take 1 tablet (20 mg total) by mouth daily.   3 tablet   0   . hydrALAZINE (APRESOLINE) 100 MG tablet   Oral   Take 1 tablet (100 mg total) by mouth every 8 (eight) hours.   90 tablet   6   . isosorbide mononitrate (IMDUR) 30 MG 24 hr tablet   Oral   Take 1 tablet (30 mg total) by mouth 2 (two) times daily.   30 tablet   6   . metoprolol succinate (TOPROL-XL) 100 MG 24 hr tablet   Oral   Take 100 mg by  mouth daily.          . sitaGLIPtin (JANUVIA) 100 MG tablet   Oral   Take 100 mg by mouth daily.         . valsartan (DIOVAN) 80 MG tablet   Oral   Take 1 tablet (80 mg total) by mouth 2 (two) times daily.   60 tablet   6   . vitamin B-12 (CYANOCOBALAMIN) 1000 MCG tablet   Oral   Take 1,000 mcg by mouth daily.           Allergies Review of patient's allergies indicates no known allergies.  No family history on file.  Social History Social History  Substance Use Topics  . Smoking status: Never Smoker   . Smokeless tobacco: None  . Alcohol Use: No    Review of Systems  Constitutional: Negative for fever. Eyes: Negative for visual changes. ENT: Negative for sore throat. Cardiovascular: Negative for chest pain. Respiratory: Positive for shortness of breath. Gastrointestinal: Negative for abdominal pain, vomiting and diarrhea. Genitourinary: Negative for dysuria. Musculoskeletal: Negative  for back pain. Skin: Negative for rash. Neurological: Negative for headache. 10 point Review of Systems otherwise negative ____________________________________________   PHYSICAL EXAM:  VITAL SIGNS: ED Triage Vitals  Enc Vitals Group     BP 10/30/15 1500 145/60 mmHg     Pulse Rate 10/30/15 1438 84     Resp 10/30/15 1438 20     Temp 10/30/15 1438 97.9 F (36.6 C)     Temp Source 10/30/15 1438 Oral     SpO2 10/30/15 1438 92 %     Weight 10/30/15 1438 235 lb (106.595 kg)     Height 10/30/15 1438  (1.626 m)     Head Cir --      Peak Flow --      Pain Score --      Pain Loc --      Pain Edu? --      Excl. in GC? --      Constitutional: Alert and oriented. Well appearing and in no distress. Eyes: Conjunctivae are normal. PERRL. Normal extraocular movements. ENT   Head: Normocephalic and atraumatic.   Nose: No congestion/rhinnorhea.   Mouth/Throat: Mucous membranes are moist.   Neck: No stridor. Cardiovascular/Chest: Normal rate, regular rhythm.  No murmurs, rubs, or gallops. Respiratory: Normal respiratory effort without tachypnea nor retractions. Mild decreased breath sounds throughout with mild rhonchi. No wheezing. Gastrointestinal: Soft. No distention, no guarding, no rebound. Nontender. Obese.  Genitourinary/rectal:Deferred Musculoskeletal: Nontender with normal range of motion in all extremities. No joint effusions.  No lower extremity tenderness. 3+ lower extremity edema bilaterally of the thighs. Neurologic:  Normal speech and language. No gross or focal neurologic deficits are appreciated. Skin:  Skin is warm, dry and intact. No rash noted. Psychiatric: Mood and affect are normal. Speech and behavior are normal. Patient exhibits appropriate insight and judgment.  ____________________________________________   EKG I, Governor Rooks, MD, the attending physician have personally viewed and interpreted all ECGs.  50 bpm. Normal sinus rhythm. Narrow QRS.  Normal axis. Nonspecific T-wave ____________________________________________  LABS (pertinent positives/negatives)  Basic metabolic panel without significant abnormalities. BUN 13 and creatinine 0.95 ENP 11/12/2007 Troponin 0.03 CBC without significant abnormalities  ____________________________________________  RADIOLOGY All Xrays were viewed by me. Imaging interpreted by Radiologist.  Chest 1 view: Enlarged cardiac silhouette with pulmonary vascular congestion. __________________________________________  PROCEDURES  Procedure(s) performed: None  Critical Care performed: None  ____________________________________________   ED COURSE / ASSESSMENT  AND PLAN  CONSULTATIONS:  Hospitalist for admission  Pertinent labs & imaging results that were available during my care of the patient were reviewed by me and considered in my medical decision making (see chart for details).   Complaint is lower extremity edema and shortness of breath especially with walking. Clinically she appears to have CHF exacerbation. No hypoxia. No chest pain. Her exam and evaluation are reassuring overall, and initially considered home management of exacerbation.  However, upon getting the patient up to walk and check her O2 sat, she did become significantly dyspneic, and her O2 sat dropped to 88% and took a while at rest before coming back up to the mid 90s. Patient was placed on oxygen.  I think she will need access to home O2, and for this reason I discussed with the patient and her daughter who are agreeable for hospital admission/observation and evaluation tomorrow to be sent home with home oxygen.   Patient / Family / Caregiver informed of clinical course, medical decision-making process, and agree with plan.    ___________________________________________   FINAL CLINICAL IMPRESSION(S) / ED DIAGNOSES   Final diagnoses:  Acute on chronic congestive heart failure, unspecified congestive  heart failure type (HCC)  Hypoxia       Governor Rooks, MD 10/30/15 1805

## 2015-10-30 NOTE — ED Notes (Signed)
MD at bedside. 

## 2015-10-30 NOTE — ED Notes (Signed)
Per MD, patient to go ahead and be placed on 2L [place]

## 2015-10-31 ENCOUNTER — Inpatient Hospital Stay: Admit: 2015-10-31 | Payer: Medicare HMO

## 2015-10-31 LAB — GLUCOSE, CAPILLARY: GLUCOSE-CAPILLARY: 158 mg/dL — AB (ref 65–99)

## 2015-10-31 LAB — BASIC METABOLIC PANEL
Anion gap: 7 (ref 5–15)
BUN: 13 mg/dL (ref 6–20)
CALCIUM: 8.1 mg/dL — AB (ref 8.9–10.3)
CO2: 28 mmol/L (ref 22–32)
CREATININE: 0.91 mg/dL (ref 0.44–1.00)
Chloride: 109 mmol/L (ref 101–111)
GFR, EST NON AFRICAN AMERICAN: 59 mL/min — AB (ref >60)
Glucose, Bld: 141 mg/dL — ABNORMAL HIGH (ref 65–99)
Potassium: 3.4 mmol/L — ABNORMAL LOW (ref 3.5–5.1)
SODIUM: 144 mmol/L (ref 135–145)

## 2015-10-31 LAB — MRSA PCR SCREENING: MRSA BY PCR: NEGATIVE

## 2015-10-31 MED ORDER — FUROSEMIDE 20 MG PO TABS: 40.0000 mg | ORAL_TABLET | Freq: Two times a day (BID) | ORAL | Status: AC

## 2015-10-31 MED ORDER — APIXABAN 5 MG PO TABS
5.0000 mg | ORAL_TABLET | Freq: Two times a day (BID) | ORAL | Status: AC
Start: 2015-10-31 — End: ?

## 2015-10-31 MED ORDER — APIXABAN 5 MG PO TABS: 5.0000 mg | ORAL_TABLET | Freq: Two times a day (BID) | ORAL | Status: DC

## 2015-10-31 NOTE — Progress Notes (Signed)
Patient daughter called concerned about patient's breathing. Informed daughter that patient is on room air with O2 sats 95%. No complaints of shortness of breath. Patient requested to speak with the doctor. Notified Dr. Allena KatzPatel.

## 2015-10-31 NOTE — Progress Notes (Signed)
Patient refused to get CBG checked.

## 2015-10-31 NOTE — Progress Notes (Signed)
RN informed CSW that [facility] is [facility] [place].  [phone].  Spoke to Ms. Smith. States patient is currently on a family visit since Friday.  Daughter Pam signed her out. States patient is unable to return to facility until family signs her back in.   Facility will also try to contact patient's daughter to call.  Patient has not been in the hospital 24 hours and no medication changes made.  FL2 is not needed.  CSW signing off.  Please call if additional assistance is needed.  Sammuel Hineseborah Moore. LCSWA Clinical Social Work Department [phone] 10:41 AM

## 2015-10-31 NOTE — Progress Notes (Signed)
Patient very agitated this morning, concerned about being in the hospital. Patient spoke with daughter Rinaldo Cloudamela per patient request at [phone]. Per daughter Rinaldo Cloudamela she will contact patient's other daughter Rivka BarbaraGlenda and call back. Will continue to monitor patient Elizabeth HaskellDonnisha R Reynolds Wilkerson

## 2015-10-31 NOTE — Progress Notes (Signed)
Call from MD stating will discharge patient back to ALF. Call to patient's room, patient is upset stating she wants to leave.  Call to patient's daughter Elita Quickam, to obtain information for an assessment and coordinate getting patient back to ALF, unable to reach her.  CSW will call again.  Sammuel Hineseborah Alizia Greif. LCSWA Clinical Social Work Department [phone] 10:10 AM

## 2015-10-31 NOTE — Plan of Care (Signed)
Problem: Safety: Goal: Ability to remain free from injury will improve Outcome: Progressing No attempts of patient trying to get out of bed

## 2015-10-31 NOTE — Care Management Note (Addendum)
Case Management Note  Patient Details  Name: Elizabeth RampGlenda Wilkerson MRN: [id number] Date of Birth: 06/27/1935  Subjective/Objective:  From chart notes, it appears that [facility] on [address] [place]. Mrs Elizabeth Wilkerson was signed out of [facility] by her family and the family will need to pick Mrs Elizabeth [facility] up from [facility]. The family can either take Mrs Elizabeth Wilk[facility] back to [facility] or they can take Mrs Elizabeth Wilkerson home with them. Per [facility], the family signed her out and only the family can return her to [facility] Mrs Elizabeth Wilkerson was admitted less than 24 hours ago and will not need a PASSAR or a FL2. Her [facility] nurse can give all the hospital discharge information to the family when they pick up Mrs Elizabeth Wilkerson.  Discussed discharge plan with Sammuel Hineseborah Moore, CSW on call today.                    Action/Plan:   Expected Discharge Date:                  Expected Discharge Plan:     In-House Referral:     Discharge planning Services     Post Acute Care Choice:    Choice offered to:     DME Arranged:    DME Agency:     HH Arranged:    HH Agency:     Status of Service:     Medicare Important Message Given:    Date Medicare IM Given:    Medicare IM give by:    Date Additional Medicare IM Given:    Additional Medicare Important Message give by:     If discussed at Long Length of Stay Meetings, dates discussed:    Additional Comments:  Valerie Fredin A, RN 10/31/2015, 10:21 AM

## 2015-10-31 NOTE — Discharge Instructions (Signed)
°  DIET:  °Cardiac diet ° °DISCHARGE CONDITION:  °Stable ° °ACTIVITY:  °Activity as tolerated ° °OXYGEN:  °Home Oxygen: No. °  °Oxygen Delivery: room air ° °DISCHARGE LOCATION:  °assited living  ° ° °ADDITIONAL DISCHARGE INSTRUCTION: ° ° °If you experience worsening of your admission symptoms, develop shortness of breath, life threatening emergency, suicidal or homicidal thoughts you must seek medical attention immediately by calling 911 or calling your MD immediately  if symptoms less severe. ° °You Must read complete instructions/literature along with all the possible adverse reactions/side effects for all the Medicines you take and that have been prescribed to you. Take any new Medicines after you have completely understood and accpet all the possible adverse reactions/side effects.  ° °Please note ° °You were cared for by a hospitalist during your hospital stay. If you have any questions about your discharge medications or the care you received while you were in the hospital after you are discharged, you can call the unit and asked to speak with the hospitalist on call if the hospitalist that took care of you is not available. Once you are discharged, your primary care physician will handle any further medical issues. Please note that NO REFILLS for any discharge medications will be authorized once you are discharged, as it is imperative that you return to your primary care physician (or establish a relationship with a primary care physician if you do not have one) for your aftercare needs so that they can reassess your need for medications and monitor your lab values. ° ° °

## 2015-10-31 NOTE — Plan of Care (Signed)
Problem: Education: Goal: Knowledge of [facility] Education information/materials will improve Outcome: Not Met (add Reason) Patient is a poor historian, unable to get questioned answered.

## 2015-10-31 NOTE — Discharge Summary (Addendum)
Elizabeth Wilkerson, 80 y.o., DOB [birth year], MRN [id number]. Admission date: 10/30/2015 Discharge Date 10/31/2015 Primary MD No PCP Per Patient Admitting Physician Ramonita Lab, MD  Admission Diagnosis  Hypoxia [R09.02] Acute on chronic congestive heart failure, unspecified congestive heart failure type [facility]) [I50.9]  Discharge Diagnosis   Active Problems:   Acute chronic CHF (congestive heart failure) (HCC)  Hypertension Paroxysmal atrial fibrillation Diabetes Coronary artery disease Morbid obesity Stroke Memory loss Ischemic heart and myopathy        Hospital Course Elizabeth Wilkerson is a 80 y.o. female with a known history of paroxysmal atrial fibrillation, on eliquis, coronary artery disease, essential hypertension and ischemic cardiomyopathy is presenting to the ED with a chief complaint of 3-4 day history of progressively worsening shortness of breath associated with lower extremity edema. Chest x-ray has revealed pulmonary venous congestion. Patient was admitted for acute systolic CHF. She was treated with IV Lasix with resolution of her symptoms. She is currently not on any oxygen. I have tried to reach the daughter regarding discharge plan unable to reach Dr. daughter. Patient is agitated and states that she does not need to be in the hospital.            Consults  None  Significant Tests:  See full reports for all details      Dg Chest [facility] 1 View  10/30/2015  CLINICAL DATA:  Shortness of breath and wheezing. EXAM: PORTABLE CHEST 1 VIEW COMPARISON:  08/23/2015 FINDINGS: Cardiomediastinal silhouette is enlarged. Mediastinal contours appear intact. There is no evidence of focal airspace consolidation, pleural effusion or pneumothorax. There is persistent prominence of the vascular markings which may be seen with pulmonary vascular congestion. Osseous structures are without acute abnormality. Soft tissues are grossly normal. IMPRESSION: Enlarged cardiac silhouette with  pulmonary vascular congestion. Electronically Signed   By: Ted Mcalpine M.D.   On: 10/30/2015 15:29       Today   Subjective:   Elizabeth Wilkerson  patient currently agitated that she is in the hospital and states that does not need to be in the hospital.  Objective:   Blood pressure 144/62, pulse 76, temperature 98.2 F (36.8 C), temperature source Oral, resp. rate 19, height  (1.626 m), weight 108.909 kg (240 lb 1.6 oz), SpO2 92 %.  .  Intake/Output Summary (Last 24 hours) at 10/31/15 1233 Last data filed at 10/31/15 1100  Gross per 24 hour  Intake      0 ml  Output      0 ml  Net      0 ml    Exam VITAL SIGNS: Blood pressure 144/62, pulse 76, temperature 98.2 F (36.8 C), temperature source Oral, resp. rate 19, height  (1.626 m), weight 108.909 kg (240 lb 1.6 oz), SpO2 92 %.  GENERAL:  80 y.o.-year-old patient lying in the bed with no acute distress.  EYES: Pupils equal, round, reactive to light and accommodation. No scleral icterus. Extraocular muscles intact.  HEENT: Head atraumatic, normocephalic. Oropharynx and nasopharynx clear.  NECK:  Supple, no jugular venous distention. No thyroid enlargement, no tenderness.  LUNGS: Normal breath sounds bilaterally, no wheezing, rales,rhonchi or crepitation. No use of accessory muscles of respiration.  CARDIOVASCULAR: S1, S2 normal. No murmurs, rubs, or gallops.  ABDOMEN: Soft, nontender, nondistended. Bowel sounds present. No organomegaly or mass.  EXTREMITIES:1+  pedal edema, cyanosis, or clubbing.  NEUROLOGIC:  Nurse 2-12 grossly intact no focal deficits as  PSYCHIATRIC: The patient is Agitated that she is here once  to get out of here.  SKIN: No obvious rash, lesion, or ulcer.   Data Review     CBC w Diff:  Lab Results  Component Value Date   WBC 8.0 10/30/2015   HGB 12.2 10/30/2015   HCT 34.9* 10/30/2015   PLT 166 10/30/2015   LYMPHOPCT 25% 10/30/2015   MONOPCT 12% 10/30/2015   EOSPCT 2% 10/30/2015    BASOPCT 1% 10/30/2015   CMP:  Lab Results  Component Value Date   NA 144 10/31/2015   K 3.4* 10/31/2015   CL 109 10/31/2015   CO2 28 10/31/2015   BUN 13 10/31/2015   CREATININE 0.91 10/31/2015   PROT 7.5 08/23/2015   ALBUMIN 3.9 08/23/2015   BILITOT 1.4* 08/23/2015   ALKPHOS 117 08/23/2015   AST 27 08/23/2015   ALT 16 08/23/2015  .  Micro Results Recent Results (from the past 240 hour(s))  MRSA PCR Screening     Status: None   Collection Time: 10/31/15  6:42 AM  Result Value Ref Range Status   MRSA by PCR NEGATIVE NEGATIVE Final    Comment:        The GeneXpert MRSA Assay (FDA approved for NASAL specimens only), is one component of a comprehensive MRSA colonization surveillance program. It is not intended to diagnose MRSA infection nor to guide or monitor treatment for MRSA infections.         Code Status Orders        Start     Ordered   10/30/15 2221  Full code   Continuous     10/30/15 2220              Follow-up Information    Follow up with [facility]. Schedule an appointment as soon as possible for a visit in 1 week.   Why:  You will need to call for an appointment, ccs   Contact information:   [address] [place] [address] [phone]       Discharge Medications     Medication List    TAKE these medications        apixaban 5 MG Tabs tablet  Commonly known as:  ELIQUIS  Take 1 tablet (5 mg total) by mouth 2 (two) times daily.     atorvastatin 40 MG tablet  Commonly known as:  LIPITOR  Take 1 tablet (40 mg total) by mouth daily at 6 PM.     clopidogrel 75 MG tablet  Commonly known as:  PLAVIX  Take 75 mg by mouth daily.     diltiazem 120 MG 24 hr capsule  Commonly known as:  CARDIZEM CD  Take 120 mg by mouth daily.     furosemide 20 MG tablet  Commonly known as:  LASIX  Take 1 tablet (20 mg total) by mouth daily.     furosemide 20 MG tablet  Commonly known as:  LASIX  Take 2 tablets (40  mg total) by mouth 2 (two) times daily.     hydrALAZINE 100 MG tablet  Commonly known as:  APRESOLINE  Take 1 tablet (100 mg total) by mouth every 8 (eight) hours.     isosorbide mononitrate 30 MG 24 hr tablet  Commonly known as:  IMDUR  Take 1 tablet (30 mg total) by mouth 2 (two) times daily.     metoprolol succinate 100 MG 24 hr tablet  Commonly known as:  TOPROL-XL  Take 100 mg by mouth daily.     ranitidine  150 MG tablet  Commonly known as:  ZANTAC  Take 150 mg by mouth 2 (two) times daily.     sitaGLIPtin 100 MG tablet  Commonly known as:  JANUVIA  Take 100 mg by mouth daily.     valsartan 80 MG tablet  Commonly known as:  DIOVAN  Take 1 tablet (80 mg total) by mouth 2 (two) times daily.     vitamin B-12 1000 MCG tablet  Commonly known as:  CYANOCOBALAMIN  Take 1,000 mcg by mouth daily.           Total Time in preparing paper work, data evaluation and todays exam - 35 minutes  Auburn BilberryPATEL, Velita Quirk M.D on 10/31/2015 at 12:33 PM  [facility]   Office  [phone]

## 2015-10-31 NOTE — Progress Notes (Signed)
Patient/family received discharge instructions, pt/family verbalized understanding. IV was removed with no signs of infection. Dressing clean, dry intact. No skin tears or wounds present. Prescription was printed and given to patient. Patient was escorted out with staff member via wheelchair via private auto. No further needs from care management team.

## 2015-10-31 NOTE — Progress Notes (Signed)
Called patient's daughter Rinaldo Cloudamela at [phone] 3 times with no answer to discuss discharge plans.

## 2016-04-04 ENCOUNTER — Encounter: Payer: Self-pay | Admitting: Emergency Medicine

## 2016-04-04 ENCOUNTER — Emergency Department: Payer: Medicare HMO

## 2016-04-04 ENCOUNTER — Emergency Department
Admission: EM | Admit: 2016-04-04 | Discharge: 2016-04-04 | Disposition: A | Discharge status: Home / Self Care | Payer: Medicare HMO | Attending: Emergency Medicine | Admitting: Emergency Medicine

## 2016-04-04 DIAGNOSIS — E119 Type 2 diabetes mellitus without complications: Secondary | ICD-10-CM | POA: Insufficient documentation

## 2016-04-04 DIAGNOSIS — R1032 Left lower quadrant pain: Secondary | ICD-10-CM

## 2016-04-04 DIAGNOSIS — Z8673 Personal history of transient ischemic attack (TIA), and cerebral infarction without residual deficits: Secondary | ICD-10-CM | POA: Diagnosis not present

## 2016-04-04 DIAGNOSIS — I110 Hypertensive heart disease with heart failure: Secondary | ICD-10-CM | POA: Insufficient documentation

## 2016-04-04 DIAGNOSIS — Z79899 Other long term (current) drug therapy: Secondary | ICD-10-CM | POA: Insufficient documentation

## 2016-04-04 DIAGNOSIS — I5043 Acute on chronic combined systolic (congestive) and diastolic (congestive) heart failure: Secondary | ICD-10-CM | POA: Insufficient documentation

## 2016-04-04 DIAGNOSIS — I480 Paroxysmal atrial fibrillation: Secondary | ICD-10-CM | POA: Diagnosis not present

## 2016-04-04 DIAGNOSIS — N281 Cyst of kidney, acquired: Secondary | ICD-10-CM | POA: Insufficient documentation

## 2016-04-04 DIAGNOSIS — Z7984 Long term (current) use of oral hypoglycemic drugs: Secondary | ICD-10-CM | POA: Insufficient documentation

## 2016-04-04 DIAGNOSIS — I2510 Atherosclerotic heart disease of native coronary artery without angina pectoris: Secondary | ICD-10-CM | POA: Diagnosis not present

## 2016-04-04 HISTORY — DX: Heart failure, unspecified: I50.9

## 2016-04-04 LAB — URINALYSIS COMPLETE WITH MICROSCOPIC (ARMC ONLY)
Bacteria, UA: NONE SEEN
Bilirubin Urine: NEGATIVE
Glucose, UA: NEGATIVE mg/dL
Hgb urine dipstick: NEGATIVE
KETONES UR: NEGATIVE mg/dL
Leukocytes, UA: NEGATIVE
Nitrite: NEGATIVE
PROTEIN: NEGATIVE mg/dL
SPECIFIC GRAVITY, URINE: 1.019 (ref 1.005–1.030)
pH: 5 (ref 5.0–8.0)

## 2016-04-04 LAB — COMPREHENSIVE METABOLIC PANEL
ALT: 14 U/L (ref 14–54)
AST: 21 U/L (ref 15–41)
Albumin: 3.6 g/dL (ref 3.5–5.0)
Alkaline Phosphatase: 106 U/L (ref 38–126)
Anion gap: 8 (ref 5–15)
BUN: 26 mg/dL — ABNORMAL HIGH (ref 6–20)
CHLORIDE: 102 mmol/L (ref 101–111)
CO2: 27 mmol/L (ref 22–32)
Calcium: 8.8 mg/dL — ABNORMAL LOW (ref 8.9–10.3)
Creatinine, Ser: 1.21 mg/dL — ABNORMAL HIGH (ref 0.44–1.00)
GFR, EST AFRICAN AMERICAN: 48 mL/min — AB (ref >60)
GFR, EST NON AFRICAN AMERICAN: 42 mL/min — AB (ref >60)
Glucose, Bld: 105 mg/dL — ABNORMAL HIGH (ref 65–99)
Potassium: 4 mmol/L (ref 3.5–5.1)
SODIUM: 137 mmol/L (ref 135–145)
Total Bilirubin: 0.4 mg/dL (ref 0.3–1.2)
Total Protein: 7.5 g/dL (ref 6.5–8.1)

## 2016-04-04 LAB — CBC WITH DIFFERENTIAL/PLATELET
BASOS ABS: 0.1 10*3/uL (ref 0–0.1)
Basophils Relative: 1 %
EOS ABS: 0.1 10*3/uL (ref 0–0.7)
Eosinophils Relative: 1 %
HCT: 37.5 % (ref 35.0–47.0)
HEMOGLOBIN: 12.7 g/dL (ref 12.0–16.0)
LYMPHS ABS: 2.4 10*3/uL (ref 1.0–3.6)
MCH: 31.1 pg (ref 26.0–34.0)
MCHC: 33.9 g/dL (ref 32.0–36.0)
MCV: 91.7 fL (ref 80.0–100.0)
Monocytes Absolute: 0.8 10*3/uL (ref 0.2–0.9)
Monocytes Relative: 12 %
NEUTROS ABS: 3.7 10*3/uL (ref 1.4–6.5)
Neutrophils Relative %: 52 %
Platelets: 200 10*3/uL (ref 150–440)
RBC: 4.09 MIL/uL (ref 3.80–5.20)
RDW: 17.3 % — AB (ref 11.5–14.5)
WBC: 7 10*3/uL (ref 3.6–11.0)

## 2016-04-04 LAB — LIPASE, BLOOD: LIPASE: 32 U/L (ref 11–51)

## 2016-04-04 MED ORDER — IOPAMIDOL (ISOVUE-300) INJECTION 61%
100.0000 mL | Freq: Once | INTRAVENOUS | Status: AC | PRN
  Administered 2016-04-04: 100 mL via INTRAVENOUS

## 2016-04-04 MED ORDER — DIATRIZOATE MEGLUMINE & SODIUM 66-10 % PO SOLN
15.0000 mL | Freq: Once | ORAL | Status: AC
  Administered 2016-04-04: 15 mL via ORAL

## 2016-04-04 NOTE — ED Notes (Signed)
Assisted patient to undress for CT scan.

## 2016-04-04 NOTE — ED Notes (Signed)
Pt presents with  Pelvic pain left lower since last Wednesday.

## 2016-04-04 NOTE — Discharge Instructions (Signed)
Abdominal Pain, Adult Many things can cause abdominal pain. Usually, abdominal pain is not caused by a disease and will improve without treatment. It can often be observed and treated at home. Your health care provider will do a physical exam and possibly order blood tests and X-rays to help determine the seriousness of your pain. However, in many cases, more time must pass before a clear cause of the pain can be found. Before that point, your health care provider may not know if you need more testing or further treatment. HOME CARE INSTRUCTIONS Monitor your abdominal pain for any changes. The following actions may help to alleviate any discomfort you are experiencing:  Only take over-the-counter or prescription medicines as directed by your health care provider.  Do not take laxatives unless directed to do so by your health care provider.  Try a clear liquid diet (broth, tea, or water) as directed by your health care provider. Slowly move to a bland diet as tolerated. SEEK MEDICAL CARE IF:  You have unexplained abdominal pain.  You have abdominal pain associated with nausea or diarrhea.  You have pain when you urinate or have a bowel movement.  You experience abdominal pain that wakes you in the night.  You have abdominal pain that is worsened or improved by eating food.  You have abdominal pain that is worsened with eating fatty foods.  You have a fever. SEEK IMMEDIATE MEDICAL CARE IF:  Your pain does not go away within 2 hours.  You keep throwing up (vomiting).  Your pain is felt only in portions of the abdomen, such as the right side or the left lower portion of the abdomen.  You pass bloody or black tarry stools. MAKE SURE YOU:  Understand these instructions.  Will watch your condition.  Will get help right away if you are not doing well or get worse.   This information is not intended to replace advice given to you by your health care provider. Make sure you discuss  any questions you have with your health care provider.   Document Released: 08/02/2005 Document Revised: 07/14/2015 Document Reviewed: 07/02/2013 Elsevier Interactive Patient Education 2016 [employer]  Renal Mass A renal mass is a growth in the kidney. Some masses are harmful and may cause cancer. Others are harmless. A renal mass may be solid or filled with fluid. Those that are filled with fluid are called cysts. CAUSES Usually, the cause of a renal mass is unknown. However, certain types of cancers and infections can cause a renal mass.  SIGNS AND SYMPTOMS Symptoms may include:  Blood in the urine.  Pain in the side or back (flank pain).  Feeling full soon after eating.  Weight loss.  Swelling in the abdomen. Some renal masses do not cause symptoms. DIAGNOSIS A renal mass may be found with a CT scan, ultrasound, or MRI of your abdomen.  TREATMENT Treatment will depend on the type of renal mass.  If the renal mass is a cyst that is not causing problems, you will not need treatment.  If the renal mass is a cyst that is causing problems, it may need to be drained during a type of surgery called laparoscopic surgery.  If the renal mass is solid, it may need to be removed with a surgery to your abdomen.  If the renal mass is caused by kidney cancer, you may need surgery to remove all or part of your kidney. You may need to see your health care provider once  or twice a year to have CT scans and ultrasounds done. Having these tests will allow your health care provider to see if your renal mass has changed or gotten bigger. HOME CARE INSTRUCTIONS What you need to do at home will depend on the type of renal mass that you have. The treatment you had also will make a difference. Follow the instructions your health care provider gives you. In general:  Keep all follow-up visits as directed by your health care provider.  Take medicines only as directed by your health care  provider. SEEK MEDICAL CARE IF:  You have abdominal pain.  You have flank pain.  You have a fever. SEEK IMMEDIATE MEDICAL CARE IF:   Your pain gets worse.  There is blood in your urine.  You cannot urinate.  You have chest pain.  You have trouble breathing. MAKE SURE YOU:  Understand these instructions.  Will watch your condition.  Will get help right away if you are not doing well or get worse.   This information is not intended to replace advice given to you by your health care provider. Make sure you discuss any questions you have with your health care provider.   Document Released: 05/20/2014 Document Reviewed: 05/20/2014 Elsevier Interactive Patient Education [employer]

## 2016-04-04 NOTE — ED Provider Notes (Signed)
[facility] Emergency Department Provider Note  ____________________________________________  Time seen: 10:00 AM  I have reviewed the triage vital signs and the nursing notes.   HISTORY  Chief Complaint Pelvic Pain    HPI Elizabeth Wilkerson is a 81 y.o. female who complains of left lower quadrant pain for the past 5 days. Pain is nonradiating, intermittent, moderate intensity. Feels sharp. Last for a few seconds at a time. No aggravating or alleviating factors. No vomiting diarrhea chills fever. No dysuria frequency urgency, but she states the pain does feel better after she urinates.     Past Medical History  Diagnosis Date  . Hypertension   . PAF (paroxysmal atrial fibrillation) (HCC)     a. on Eliquis  . Diabetes mellitus without complication (HCC)   . CAD (coronary artery disease)     a. medically managed; b. last cardiac cath 11/2014  . Morbid obesity (HCC)   . Stroke (HCC)   . Diabetes mellitus (HCC)   . Memory loss   . Ischemic cardiomyopathy   . CHF (congestive heart failure) [facility])      Patient Active Problem List   Diagnosis Date Noted  . Acute CHF (congestive heart failure) (HCC) 10/30/2015  . Essential hypertension, malignant 08/25/2015  . Acute on chronic combined systolic and diastolic CHF (congestive heart failure) (HCC)   . SOB (shortness of breath)   . Tachypnea   . CVA (cerebral infarction) 08/23/2015  . CAD (coronary artery disease) 08/23/2015  . Atrial fibrillation (HCC) 08/23/2015  . HTN (hypertension) 08/23/2015  . Acute pulmonary edema (HCC) 08/23/2015  . CHF, acute on chronic (HCC) 08/23/2015  . CHF (congestive heart failure) (HCC) 08/23/2015     History reviewed. No pertinent past surgical history.   Current Outpatient Rx  Name  Route  Sig  Dispense  Refill  . apixaban (ELIQUIS) 5 MG TABS tablet   Oral   Take 1 tablet (5 mg total) by mouth 2 (two) times daily.   60 tablet   0   . atorvastatin (LIPITOR) 40 MG  tablet   Oral   Take 1 tablet (40 mg total) by mouth daily at 6 PM.   30 tablet   6   . clopidogrel (PLAVIX) 75 MG tablet   Oral   Take 75 mg by mouth daily.         Marland Kitchen diltiazem (CARDIZEM CD) 120 MG 24 hr capsule   Oral   Take 120 mg by mouth daily.         . furosemide (LASIX) 20 MG tablet   Oral   Take 1 tablet (20 mg total) by mouth daily.   3 tablet   0   . furosemide (LASIX) 20 MG tablet   Oral   Take 2 tablets (40 mg total) by mouth 2 (two) times daily.   30 tablet   6   . hydrALAZINE (APRESOLINE) 100 MG tablet   Oral   Take 1 tablet (100 mg total) by mouth every 8 (eight) hours.   90 tablet   6   . isosorbide mononitrate (IMDUR) 30 MG 24 hr tablet   Oral   Take 1 tablet (30 mg total) by mouth 2 (two) times daily.   30 tablet   6   . metoprolol succinate (TOPROL-XL) 100 MG 24 hr tablet   Oral   Take 100 mg by mouth daily.          . ranitidine (ZANTAC) 150 MG tablet  Oral   Take 150 mg by mouth 2 (two) times daily.         . sitaGLIPtin (JANUVIA) 100 MG tablet   Oral   Take 100 mg by mouth daily.         . valsartan (DIOVAN) 80 MG tablet   Oral   Take 1 tablet (80 mg total) by mouth 2 (two) times daily.   60 tablet   6   . vitamin B-12 (CYANOCOBALAMIN) 1000 MCG tablet   Oral   Take 1,000 mcg by mouth daily.            Allergies Review of patient's allergies indicates no known allergies.   No family history on file.  Social History Social History  Substance Use Topics  . Smoking status: Never Smoker   . Smokeless tobacco: None  . Alcohol Use: No    Review of Systems  Constitutional:   No fever or chills.  Eyes:   No vision changes.  ENT:   No sore throat. No rhinorrhea. Cardiovascular:   No chest pain. Respiratory:   No dyspnea or cough. Gastrointestinal:   Left lower quadrant abdominal pain as above.  Genitourinary:   Negative for dysuria or difficulty urinating. Musculoskeletal:   Negative for focal pain or  swelling Neurological:   Negative for headaches 10-point ROS otherwise negative.  ____________________________________________   PHYSICAL EXAM:  VITAL SIGNS: ED Triage Vitals  Enc Vitals Group     BP 04/04/16 0946 130/57 mmHg     Pulse Rate 04/04/16 0946 64     Resp 04/04/16 0946 20     Temp 04/04/16 0946 97.7 F (36.5 C)     Temp Source 04/04/16 0946 Oral     SpO2 04/04/16 0946 94 %     Weight --      Height --      Head Cir --      Peak Flow --      Pain Score 04/04/16 0946 7     Pain Loc --      Pain Edu? --      Excl. in GC? --     Vital signs reviewed, nursing assessments reviewed.   Constitutional:   Alert and oriented. Well appearing and in no distress. Eyes:   No scleral icterus. No conjunctival pallor. PERRL. EOMI.  No nystagmus. ENT   Head:   Normocephalic and atraumatic.   Nose:   No congestion/rhinnorhea. No septal hematoma   Mouth/Throat:   MMM, no pharyngeal erythema. No peritonsillar mass.    Neck:   No stridor. No SubQ emphysema. No meningismus. Hematological/Lymphatic/Immunilogical:   No cervical lymphadenopathy. Cardiovascular:   RRR. Symmetric bilateral radial and DP pulses.  No murmurs.  Respiratory:   Normal respiratory effort without tachypnea nor retractions. Breath sounds are clear and equal bilaterally. No wheezes/rales/rhonchi. Gastrointestinal:   Soft with left lower quadrant tenderness. Non distended. There is no CVA tenderness.  No rebound, rigidity, or guarding. Genitourinary:   deferred Musculoskeletal:   Nontender with normal range of motion in all extremities. No joint effusions.  No lower extremity tenderness.  No edema. Neurologic:   Normal speech and language.  CN 2-10 normal. Motor grossly intact. No gross focal neurologic deficits are appreciated.  Skin:    Skin is warm, dry and intact. No rash noted.  No petechiae, purpura, or bullae.  ____________________________________________    LABS (pertinent  positives/negatives) (all labs ordered are listed, but only abnormal results are displayed) Labs Reviewed  URINALYSIS  COMPLETEWITH MICROSCOPIC ([facility] ONLY) - Abnormal; Notable for the following:    Color, Urine YELLOW (*)    APPearance CLEAR (*)    Squamous Epithelial / LPF 0-5 (*)    All other components within normal limits  COMPREHENSIVE METABOLIC PANEL - Abnormal; Notable for the following:    Glucose, Bld 105 (*)    BUN 26 (*)    Creatinine, Ser 1.21 (*)    Calcium 8.8 (*)    GFR calc non Af Amer 42 (*)    GFR calc Af Amer 48 (*)    All other components within normal limits  URINE CULTURE  LIPASE, BLOOD  CBC WITH DIFFERENTIAL/PLATELET  CBC WITH DIFFERENTIAL/PLATELET  CBC WITH DIFFERENTIAL/PLATELET  CBC WITH DIFFERENTIAL/PLATELET   ____________________________________________   EKG    ____________________________________________    RADIOLOGY  CT abdomen pelvis unremarkable except for 8 cm left renal complex cyst. Borderline abdominal aortic aneurysm.  ____________________________________________   PROCEDURES   ____________________________________________   INITIAL IMPRESSION / ASSESSMENT AND PLAN / ED COURSE  Pertinent labs & imaging results that were available during my care of the patient were reviewed by me and considered in my medical decision making (see chart for details).  Patient well appearing no acute distress. Vital signs unremarkable. Presents with left lower quadrant pain and tenderness, labs unremarkable, CT scan obtained which was unremarkable. Patient was informed of the 8 cm left renal cyst and to follow up with primary care closely. I also informed them of the borderline abdominal aortic aneurysm and that current guidelines would recommend follow-up ultrasound in 2-3 years. The follow-up with primary care for this. No other acute findings. Vital signs are stable. Patient pain has resolved during the time in the emergency department as she is  ambulatory with her walker and feels like she is at her baseline state of health and currently asymptomatic. We'll discharge home follow up with primary care.     ____________________________________________   FINAL CLINICAL IMPRESSION(S) / ED DIAGNOSES  Final diagnoses:  LLQ pain  Renal cyst, left       Portions of this note were generated with dragon dictation software. Dictation errors may occur despite best attempts at proofreading.   Sharman Cheek, MD 04/04/16 [phone]

## 2016-04-05 LAB — URINE CULTURE

## 2016-04-30 ENCOUNTER — Other Ambulatory Visit: Payer: Self-pay

## 2016-04-30 ENCOUNTER — Emergency Department (HOSPITAL_COMMUNITY): Payer: Medicare HMO

## 2016-04-30 ENCOUNTER — Encounter (HOSPITAL_COMMUNITY): Payer: Self-pay

## 2016-04-30 ENCOUNTER — Emergency Department (HOSPITAL_COMMUNITY)
Admission: EM | Admit: 2016-04-30 | Discharge: 2016-04-30 | Disposition: A | Discharge status: Home / Self Care | Payer: Medicare HMO | Attending: Emergency Medicine | Admitting: Emergency Medicine

## 2016-04-30 DIAGNOSIS — Z8673 Personal history of transient ischemic attack (TIA), and cerebral infarction without residual deficits: Secondary | ICD-10-CM | POA: Insufficient documentation

## 2016-04-30 DIAGNOSIS — Z79899 Other long term (current) drug therapy: Secondary | ICD-10-CM | POA: Diagnosis not present

## 2016-04-30 DIAGNOSIS — I509 Heart failure, unspecified: Secondary | ICD-10-CM | POA: Insufficient documentation

## 2016-04-30 DIAGNOSIS — R109 Unspecified abdominal pain: Secondary | ICD-10-CM | POA: Diagnosis present

## 2016-04-30 DIAGNOSIS — I2510 Atherosclerotic heart disease of native coronary artery without angina pectoris: Secondary | ICD-10-CM | POA: Diagnosis not present

## 2016-04-30 DIAGNOSIS — Z7901 Long term (current) use of anticoagulants: Secondary | ICD-10-CM | POA: Insufficient documentation

## 2016-04-30 DIAGNOSIS — I110 Hypertensive heart disease with heart failure: Secondary | ICD-10-CM | POA: Diagnosis not present

## 2016-04-30 DIAGNOSIS — E119 Type 2 diabetes mellitus without complications: Secondary | ICD-10-CM | POA: Insufficient documentation

## 2016-04-30 DIAGNOSIS — R0789 Other chest pain: Secondary | ICD-10-CM

## 2016-04-30 MED ORDER — TRAMADOL HCL 50 MG PO TABS: 50.0000 mg | ORAL_TABLET | Freq: Four times a day (QID) | ORAL | Status: AC | PRN

## 2016-04-30 MED ORDER — LORAZEPAM 2 MG/ML IJ SOLN
0.5000 mg | Freq: Once | INTRAMUSCULAR | Status: AC
  Administered 2016-04-30: 0.5 mg via INTRAMUSCULAR
  Filled 2016-04-30: qty 1

## 2016-04-30 MED ORDER — KETOROLAC TROMETHAMINE 15 MG/ML IJ SOLN
15.0000 mg | Freq: Once | INTRAMUSCULAR | Status: AC
  Administered 2016-04-30: 15 mg via INTRAMUSCULAR
  Filled 2016-04-30: qty 1

## 2016-04-30 MED ORDER — HYDROMORPHONE HCL 1 MG/ML IJ SOLN
0.7500 mg | Freq: Once | INTRAMUSCULAR | Status: AC
  Administered 2016-04-30: 0.75 mg via INTRAMUSCULAR
  Filled 2016-04-30: qty 1

## 2016-04-30 NOTE — ED Notes (Signed)
Pt out of room for testing. 

## 2016-04-30 NOTE — Discharge Instructions (Signed)
Chest Wall Pain Chest wall pain is pain in or around the bones and muscles of your chest. Sometimes, an injury causes this pain. Sometimes, the cause may not be known. This pain may take several weeks or longer to get better. HOME CARE Pay attention to any changes in your symptoms. Take these actions to help with your pain:  Rest as told by your doctor.  Avoid activities that cause pain. Try not to use your chest, belly (abdominal), or side muscles to lift heavy things.  If directed, apply ice to the painful area:  Put ice in a plastic bag.  Place a towel between your skin and the bag.  Leave the ice on for 20 minutes, 2-3 times per day.  Take over-the-counter and prescription medicines only as told by your doctor.  Do not use tobacco products, including cigarettes, chewing tobacco, and e-cigarettes. If you need help quitting, ask your doctor.  Keep all follow-up visits as told by your doctor. This is important. GET HELP IF:  You have a fever.  Your chest pain gets worse.  You have new symptoms. GET HELP RIGHT AWAY IF:  You feel sick to your stomach (nauseous) or you throw up (vomit).  You feel sweaty or light-headed.  You have a cough with phlegm (sputum) or you cough up blood.  You are short of breath.   This information is not intended to replace advice given to you by your health care provider. Make sure you discuss any questions you have with your health care provider.   Document Released: 04/10/2008 Document Revised: 07/14/2015 Document Reviewed: 01/18/2015 Elsevier Interactive Patient Education 2016 [employer] Inc.  Chest Wall Pain Chest wall pain is pain in or around the bones and muscles of your chest. Sometimes, an injury causes this pain. Sometimes, the cause may not be known. This pain may take several weeks or longer to get better. HOME CARE INSTRUCTIONS  Pay attention to any changes in your symptoms. Take these actions to help with your pain:   Rest as  told by your health care provider.   Avoid activities that cause pain. These include any activities that use your chest muscles or your abdominal and side muscles to lift heavy items.   If directed, apply ice to the painful area:  Put ice in a plastic bag.  Place a towel between your skin and the bag.  Leave the ice on for 20 minutes, 2-3 times per day.  Take over-the-counter and prescription medicines only as told by your health care provider.  Do not use tobacco products, including cigarettes, chewing tobacco, and e-cigarettes. If you need help quitting, ask your health care provider.  Keep all follow-up visits as told by your health care provider. This is important. SEEK MEDICAL CARE IF:  You have a fever.  Your chest pain becomes worse.  You have new symptoms. SEEK IMMEDIATE MEDICAL CARE IF:  You have nausea or vomiting.  You feel sweaty or light-headed.  You have a cough with phlegm (sputum) or you cough up blood.  You develop shortness of breath.   This information is not intended to replace advice given to you by your health care provider. Make sure you discuss any questions you have with your health care provider.   Document Released: 10/23/2005 Document Revised: 07/14/2015 Document Reviewed: 01/18/2015 Elsevier Interactive Patient Education [employer]

## 2016-04-30 NOTE — ED Notes (Signed)
Per EMS[facility] [place], sustained a fall on the R side. Pt complaining of R flank pain. EMS denies any head injury, or LOC. Pt baseline dementia, oriented to self only.

## 2016-05-02 ENCOUNTER — Telehealth: Payer: Self-pay | Admitting: Emergency Medicine

## 2016-05-17 NOTE — ED Provider Notes (Signed)
CSN: [id number]     Arrival date & time 04/30/16  1627 History   First MD Initiated Contact with Patient 04/30/16 1634     Chief Complaint  Patient presents with  . Fall  . Flank Pain     (Consider location/radiation/quality/duration/timing/severity/associated sxs/prior Treatment) HPI   80yF with R sided pain. R chest/flank. Apparently fell. Pt with hx of dementia and doesn't provide a detailed history. Reportedly at her baseline. She denies HA or neck pain. Pain worse with movement. Is on plavix. Denies SOB.   Past Medical History  Diagnosis Date  . Hypertension   . PAF (paroxysmal atrial fibrillation) (HCC)     a. on Eliquis  . Diabetes mellitus without complication (HCC)   . CAD (coronary artery disease)     a. medically managed; b. last cardiac cath 11/2014  . Morbid obesity (HCC)   . Stroke (HCC)   . Diabetes mellitus (HCC)   . Memory loss   . Ischemic cardiomyopathy   . CHF (congestive heart failure) (HCC)    History reviewed. No pertinent past surgical history. History reviewed. No pertinent family history. Social History  Substance Use Topics  . Smoking status: Never Smoker   . Smokeless tobacco: None  . Alcohol Use: No   OB History    No data available     Review of Systems  All systems reviewed and negative, other than as noted in HPI.   Allergies  Review of patient's allergies indicates no known allergies.  Home Medications   Prior to Admission medications   Medication Sig Start Date End Date Taking? Authorizing Provider  apixaban (ELIQUIS) 5 MG TABS tablet Take 1 tablet (5 mg total) by mouth 2 (two) times daily. 10/31/15   Auburn BilberryShreyang Patel, MD  atorvastatin (LIPITOR) 40 MG tablet Take 1 tablet (40 mg total) by mouth daily at 6 PM. 08/25/15   Katharina Caperima Vaickute, MD  clopidogrel (PLAVIX) 75 MG tablet Take 75 mg by mouth daily.    Historical Provider, MD  diltiazem (CARDIZEM CD) 120 MG 24 hr capsule Take 120 mg by mouth daily.    Historical Provider, MD   furosemide (LASIX) 20 MG tablet Take 1 tablet (20 mg total) by mouth daily. 10/30/15   Governor Rooksebecca Lord, MD  furosemide (LASIX) 20 MG tablet Take 2 tablets (40 mg total) by mouth 2 (two) times daily. 10/31/15   Auburn BilberryShreyang Patel, MD  hydrALAZINE (APRESOLINE) 100 MG tablet Take 1 tablet (100 mg total) by mouth every 8 (eight) hours. 08/25/15   Katharina Caperima Vaickute, MD  isosorbide mononitrate (IMDUR) 30 MG 24 hr tablet Take 1 tablet (30 mg total) by mouth 2 (two) times daily. 08/25/15   Katharina Caperima Vaickute, MD  metoprolol succinate (TOPROL-XL) 100 MG 24 hr tablet Take 100 mg by mouth daily.     Historical Provider, MD  ranitidine (ZANTAC) 150 MG tablet Take 150 mg by mouth 2 (two) times daily.    Historical Provider, MD  sitaGLIPtin (JANUVIA) 100 MG tablet Take 100 mg by mouth daily.    Historical Provider, MD  traMADol (ULTRAM) 50 MG tablet Take 1 tablet (50 mg total) by mouth every 6 (six) hours as needed. 04/30/16   Raeford RazorStephen Adiyah Lame, MD  valsartan (DIOVAN) 80 MG tablet Take 1 tablet (80 mg total) by mouth 2 (two) times daily. 08/25/15   Katharina Caperima Vaickute, MD  vitamin B-12 (CYANOCOBALAMIN) 1000 MCG tablet Take 1,000 mcg by mouth daily.    Historical Provider, MD   BP 150/73 mmHg  Pulse 60  Temp(Src) 98.4 F (36.9 C) (Oral)  Resp 18  SpO2 92% Physical Exam  Constitutional: She appears well-developed and well-nourished. No distress.  HENT:  Head: Normocephalic and atraumatic.  Eyes: Conjunctivae are normal. Right eye exhibits no discharge. Left eye exhibits no discharge.  Neck: Neck supple.  Cardiovascular: Normal rate, regular rhythm and normal heart sounds.  Exam reveals no gallop and no friction rub.   No murmur heard. Pulmonary/Chest: Effort normal and breath sounds normal. No respiratory distress. She exhibits tenderness.  R lateral CW tenderness. No overlying skin changes.  Abdominal: Soft. She exhibits no distension. There is no tenderness.  Musculoskeletal: She exhibits no edema or tenderness.   Neurological: She is alert.  Skin: Skin is warm and dry.  Psychiatric: She has a normal mood and affect. Her behavior is normal. Thought content normal.  Nursing note and vitals reviewed.   ED Course  Procedures (including critical care time) Labs Review Labs Reviewed - No data to display  Imaging Review No results found. I have personally reviewed and evaluated these images and lab results as part of my medical decision-making.   EKG Interpretation   Date/Time:  Sunday April 30 2016 16:28:22 EDT Ventricular Rate:  63 PR Interval:    QRS Duration: 97 QT Interval:  447 QTC Calculation: 458 R Axis:   53 Text Interpretation:  Sinus rhythm Borderline repolarization abnormality  Baseline wander in lead(s) I aVR Confirmed by Juleen China  MD, Tanav Orsak ([id number]) on  04/30/2016 5:49:39 PM      MDM   Final diagnoses:  Right-sided chest wall pain    80yF with R CW pain. Possible R fifth rib fx. Corresponds to area of tenderness. Abdominal exam benign. PRN pain meds. It has been determined that no acute conditions requiring further emergency intervention are present at this time. The patient has been advised of the diagnosis and plan. I reviewed any labs and imaging including any potential incidental findings. We have discussed signs and symptoms that warrant return to the ED and they are listed in the discharge instructions.      Raeford Razor, MD 05/18/16 1013

## 2016-10-06 DEATH — deceased

## 2017-04-20 IMAGING — CR DG RIBS W/ CHEST 3+V*R*
3 series · 4 of 4 positions shown · non-contrast
Comparison: Chest x-ray October 30, 2015

CLINICAL DATA: Pain after fall.  Right-sided pain.

EXAM:
RIGHT RIBS AND CHEST - 3+ VIEW

[chest pa]
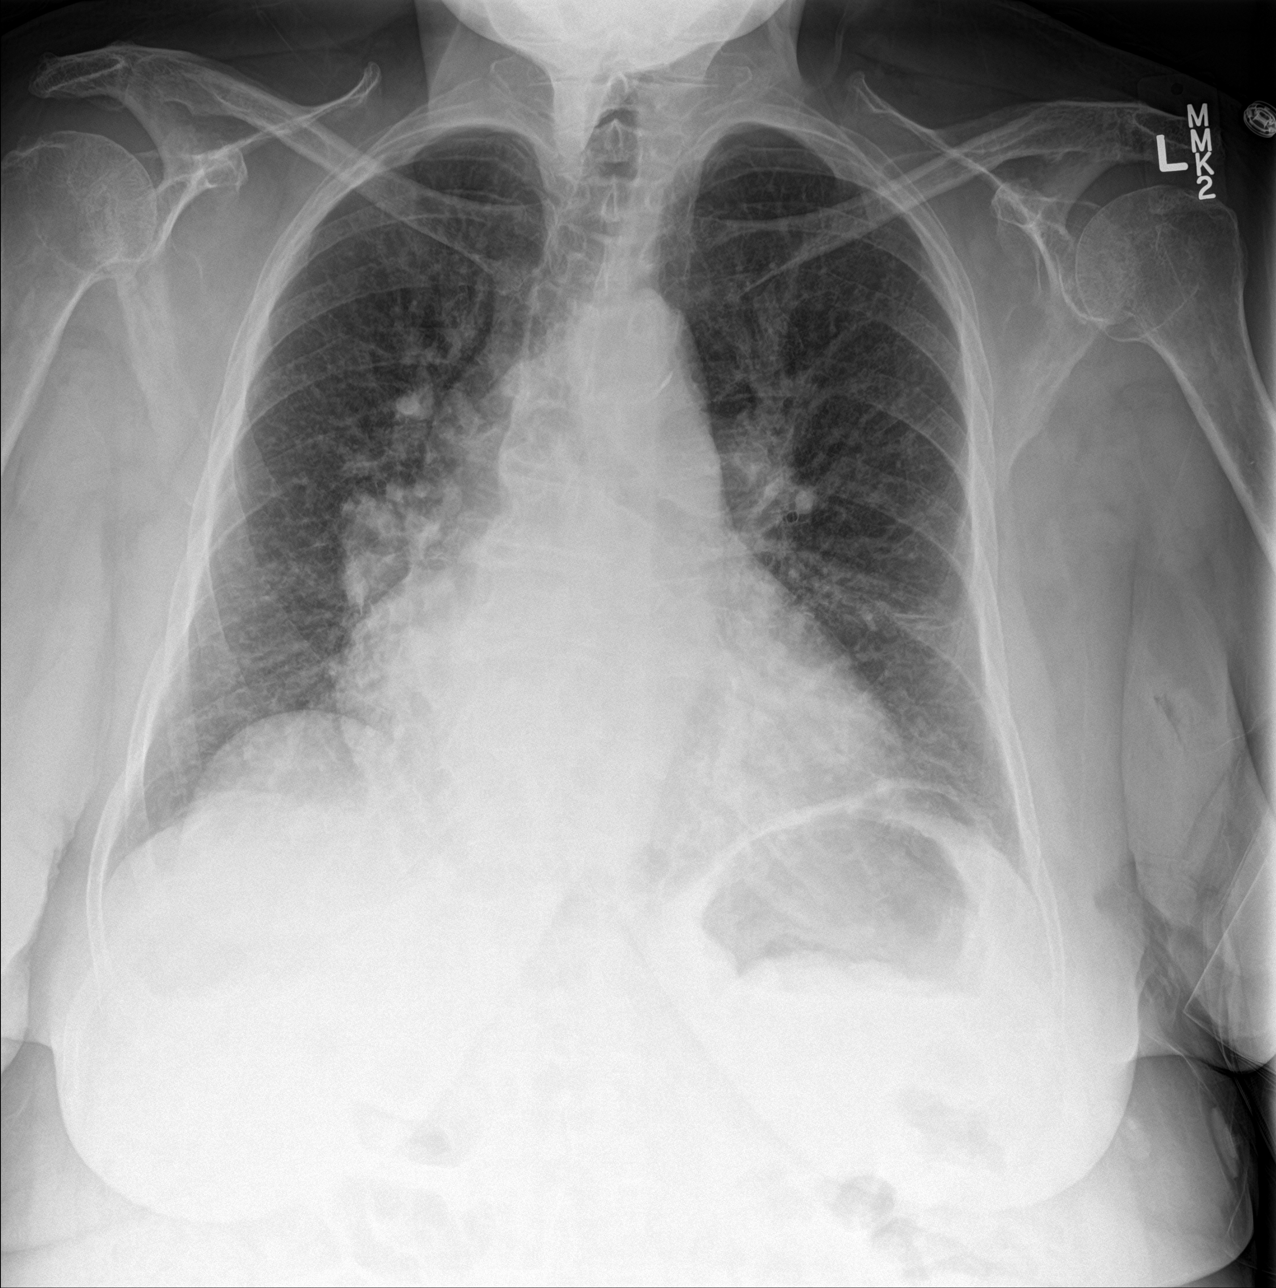

[Series 2: rib pa · 0.14mm/px · 2 of 2 slices shown]
[im 1/2]
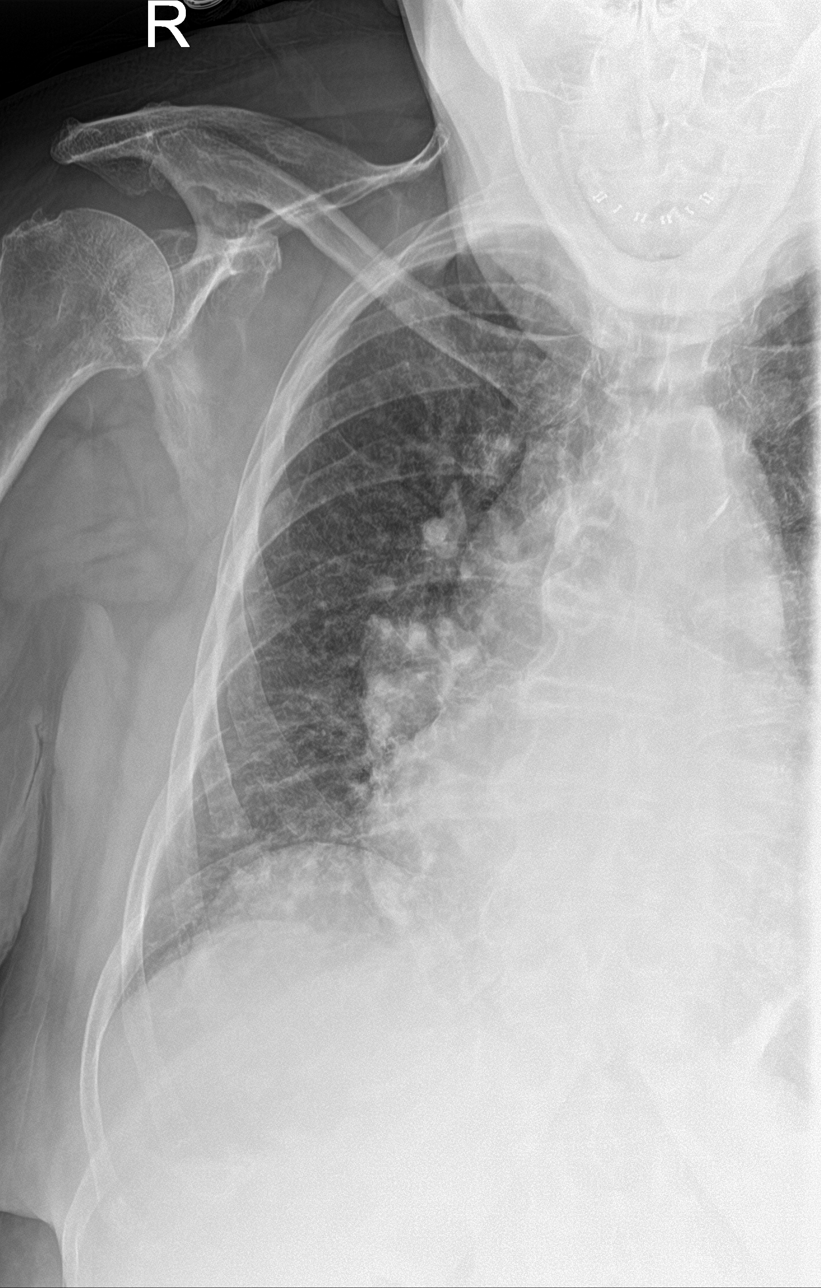
[im 2/2]
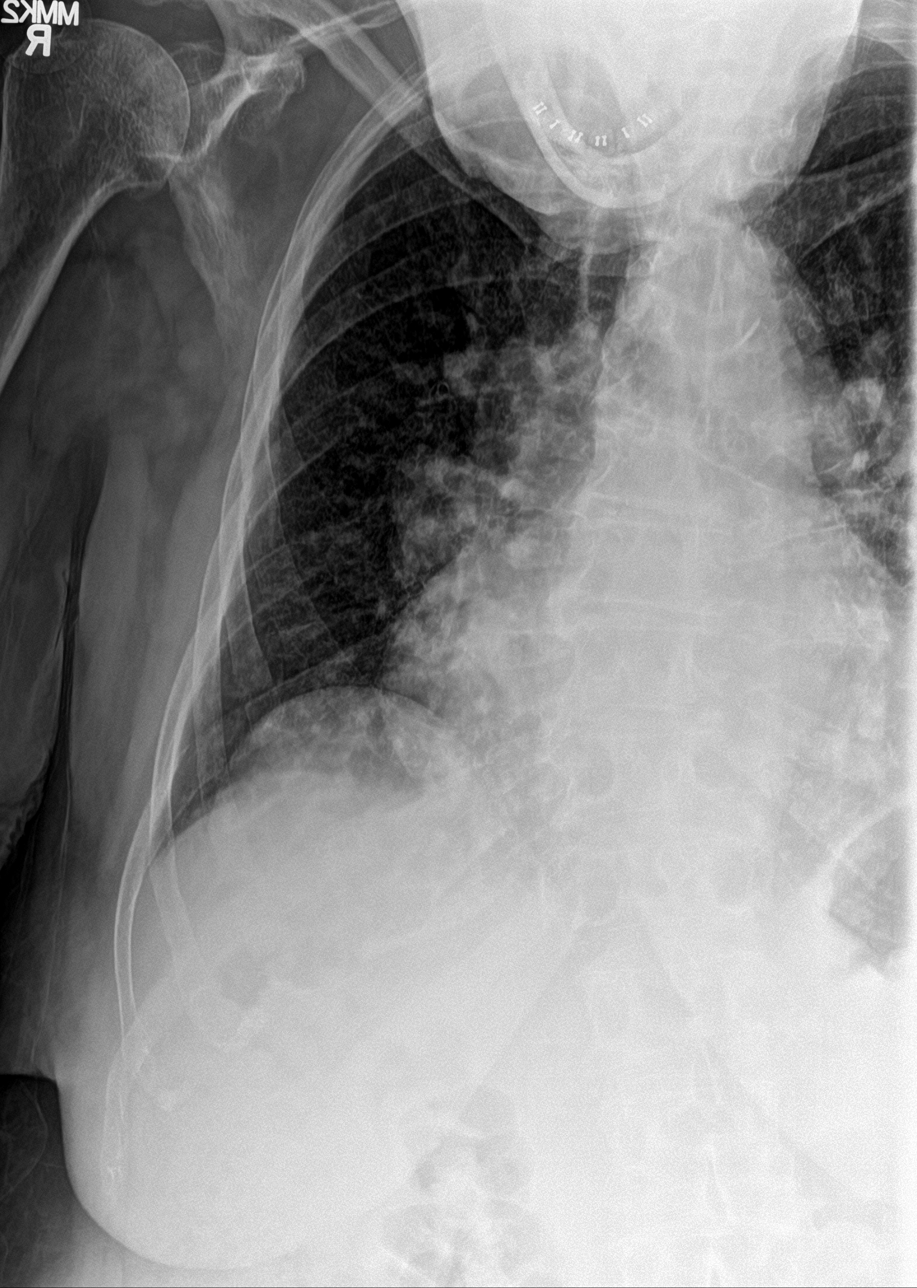

[rib pa obl]
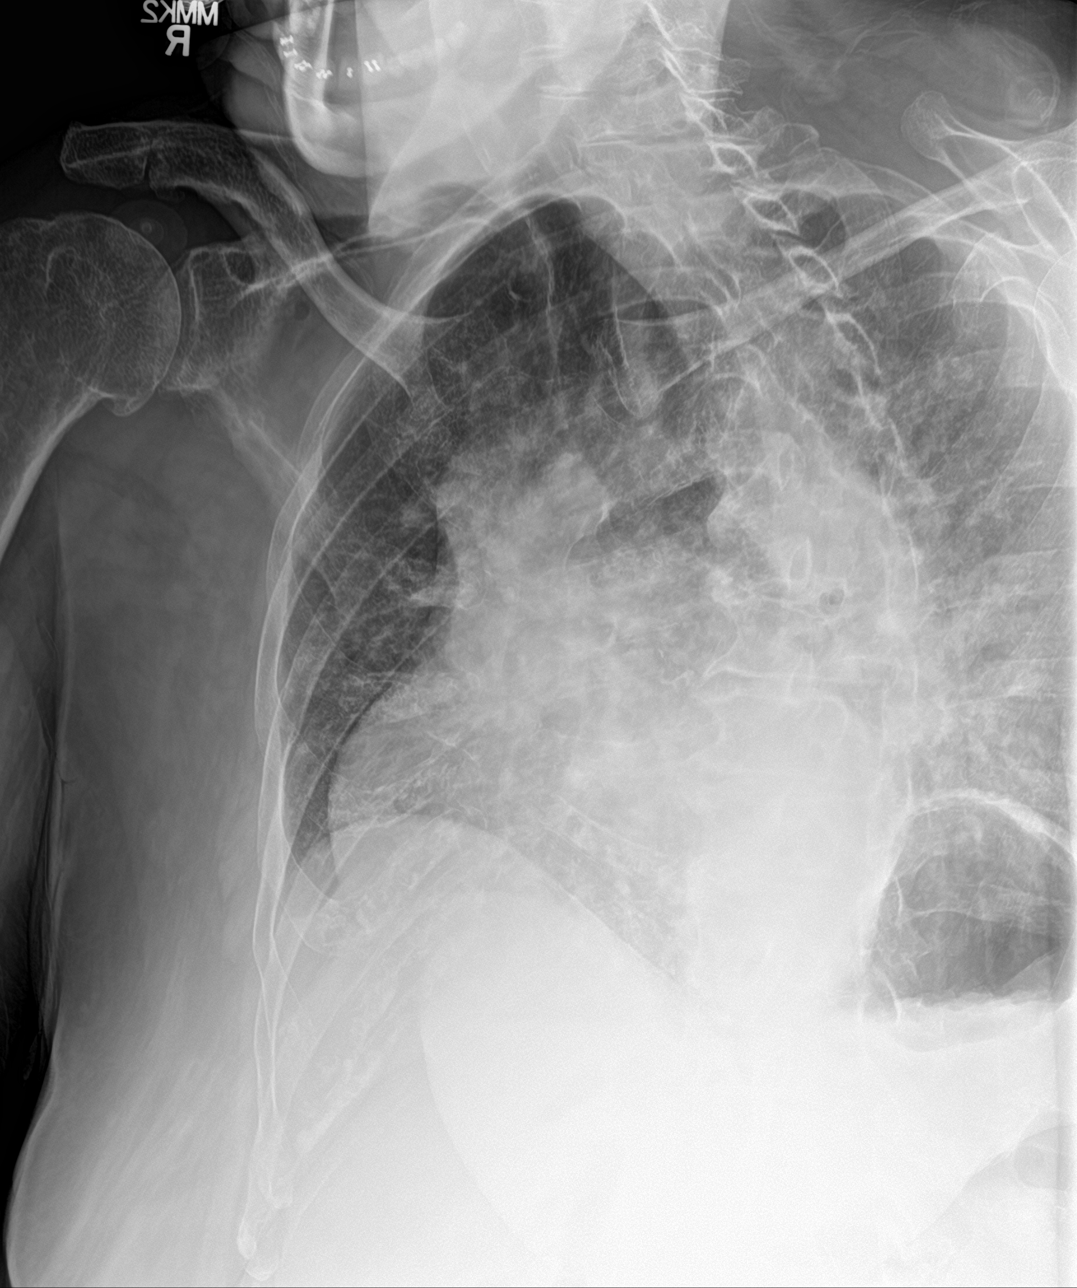

[4 of 4 positions shown; findings below may reference images not displayed]

FINDINGS: Stable cardiomegaly. The hila and mediastinum are unchanged. No
pneumothorax. No pulmonary nodules, masses, or focal infiltrates.
Eventration of the right hemidiaphragm is again identified. There is
a subtle contour abnormality in the anterior lateral right fifth
rib. No other bony abnormalities.
IMPRESSION: Subtle contour abnormality in the anterior lateral right fifth rib.
A nondisplaced fracture is not excluded. Recommend clinical
correlation. No pneumothorax.
